# Patient Record
Sex: Male | Born: 1975 | Race: White | Hispanic: No | Marital: Married | Smoking: Former smoker
Health system: Southern US, Community
[De-identification: ages and names within clinical notes are randomized; demographics above are authoritative.]

## PROBLEM LIST (undated history)

## (undated) DIAGNOSIS — G4730 Sleep apnea, unspecified: Secondary | ICD-10-CM

## (undated) DIAGNOSIS — E538 Deficiency of other specified B group vitamins: Secondary | ICD-10-CM

## (undated) DIAGNOSIS — E785 Hyperlipidemia, unspecified: Secondary | ICD-10-CM

## (undated) DIAGNOSIS — Z8619 Personal history of other infectious and parasitic diseases: Secondary | ICD-10-CM

## (undated) DIAGNOSIS — M5124 Other intervertebral disc displacement, thoracic region: Secondary | ICD-10-CM

## (undated) DIAGNOSIS — K279 Peptic ulcer, site unspecified, unspecified as acute or chronic, without hemorrhage or perforation: Secondary | ICD-10-CM

## (undated) DIAGNOSIS — I10 Essential (primary) hypertension: Secondary | ICD-10-CM

## (undated) DIAGNOSIS — K219 Gastro-esophageal reflux disease without esophagitis: Secondary | ICD-10-CM

## (undated) DIAGNOSIS — H3500 Unspecified background retinopathy: Secondary | ICD-10-CM

## (undated) DIAGNOSIS — S43439A Superior glenoid labrum lesion of unspecified shoulder, initial encounter: Secondary | ICD-10-CM

## (undated) DIAGNOSIS — E119 Type 2 diabetes mellitus without complications: Secondary | ICD-10-CM

## (undated) DIAGNOSIS — G2581 Restless legs syndrome: Secondary | ICD-10-CM

## (undated) HISTORY — PX: VASECTOMY: SHX75

## (undated) HISTORY — PX: CERVICAL DISC SURGERY: SHX588

---
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
## 2001-08-23 DIAGNOSIS — M5124 Other intervertebral disc displacement, thoracic region: Secondary | ICD-10-CM

## 2001-08-23 HISTORY — DX: Other intervertebral disc displacement, thoracic region: M51.24

## 2009-01-13 ENCOUNTER — Emergency Department: Payer: Self-pay

## 2010-08-23 DIAGNOSIS — S43439A Superior glenoid labrum lesion of unspecified shoulder, initial encounter: Secondary | ICD-10-CM

## 2010-08-23 HISTORY — PX: SHOULDER ARTHROSCOPY W/ SUPERIOR LABRAL ANTERIOR POSTERIOR LESION REPAIR: SHX2402

## 2010-08-23 HISTORY — DX: Superior glenoid labrum lesion of unspecified shoulder, initial encounter: S43.439A

## 2011-08-13 ENCOUNTER — Ambulatory Visit: Payer: Self-pay | Admitting: Internal Medicine

## 2015-09-23 ENCOUNTER — Other Ambulatory Visit: Payer: Self-pay | Admitting: Nurse Practitioner

## 2015-09-23 DIAGNOSIS — R1032 Left lower quadrant pain: Secondary | ICD-10-CM

## 2015-09-26 ENCOUNTER — Ambulatory Visit: Admission: RE | Admit: 2015-09-26 | Payer: Federal, State, Local not specified - PPO | Source: Ambulatory Visit

## 2015-10-13 ENCOUNTER — Ambulatory Visit
Admission: RE | Admit: 2015-10-13 | Discharge: 2015-10-13 | Disposition: A | Discharge status: Home / Self Care | Payer: Federal, State, Local not specified - PPO | Source: Ambulatory Visit | Attending: Nurse Practitioner | Admitting: Nurse Practitioner

## 2015-10-13 DIAGNOSIS — R1032 Left lower quadrant pain: Secondary | ICD-10-CM | POA: Diagnosis not present

## 2015-10-13 MED ORDER — IOHEXOL 300 MG/ML  SOLN
125.0000 mL | Freq: Once | INTRAMUSCULAR | Status: AC | PRN
  Administered 2015-10-13: 125 mL via INTRAVENOUS

## 2015-12-11 ENCOUNTER — Encounter: Payer: Self-pay | Admitting: *Deleted

## 2015-12-12 ENCOUNTER — Ambulatory Visit
Admission: RE | Admit: 2015-12-12 | Payer: Federal, State, Local not specified - PPO | Source: Ambulatory Visit | Admitting: Gastroenterology

## 2015-12-12 ENCOUNTER — Encounter: Payer: Self-pay | Admitting: Registered Nurse

## 2015-12-12 ENCOUNTER — Encounter: Admission: RE | Payer: Self-pay | Source: Ambulatory Visit

## 2015-12-12 HISTORY — DX: Unspecified background retinopathy: H35.00

## 2015-12-12 HISTORY — DX: Restless legs syndrome: G25.81

## 2015-12-12 HISTORY — DX: Superior glenoid labrum lesion of unspecified shoulder, initial encounter: S43.439A

## 2015-12-12 HISTORY — DX: Type 2 diabetes mellitus without complications: E11.9

## 2015-12-12 HISTORY — DX: Personal history of other infectious and parasitic diseases: Z86.19

## 2015-12-12 HISTORY — DX: Hyperlipidemia, unspecified: E78.5

## 2015-12-12 HISTORY — DX: Deficiency of other specified B group vitamins: E53.8

## 2015-12-12 HISTORY — DX: Sleep apnea, unspecified: G47.30

## 2015-12-12 HISTORY — DX: Other intervertebral disc displacement, thoracic region: M51.24

## 2015-12-12 HISTORY — DX: Peptic ulcer, site unspecified, unspecified as acute or chronic, without hemorrhage or perforation: K27.9

## 2015-12-12 SURGERY — COLONOSCOPY WITH PROPOFOL
Anesthesia: General

## 2016-06-28 ENCOUNTER — Other Ambulatory Visit: Payer: Self-pay | Admitting: Orthopedic Surgery

## 2016-06-28 DIAGNOSIS — M2391 Unspecified internal derangement of right knee: Secondary | ICD-10-CM

## 2016-06-28 DIAGNOSIS — M25561 Pain in right knee: Secondary | ICD-10-CM

## 2016-07-09 ENCOUNTER — Ambulatory Visit
Admission: RE | Admit: 2016-07-09 | Discharge: 2016-07-09 | Disposition: A | Discharge status: Home / Self Care | Payer: Federal, State, Local not specified - PPO | Source: Ambulatory Visit | Attending: Orthopedic Surgery | Admitting: Orthopedic Surgery

## 2016-07-09 ENCOUNTER — Other Ambulatory Visit: Payer: Self-pay | Admitting: Orthopedic Surgery

## 2016-07-09 DIAGNOSIS — Z0189 Encounter for other specified special examinations: Secondary | ICD-10-CM | POA: Diagnosis not present

## 2016-07-09 DIAGNOSIS — X58XXXA Exposure to other specified factors, initial encounter: Secondary | ICD-10-CM | POA: Insufficient documentation

## 2016-07-09 DIAGNOSIS — M795 Residual foreign body in soft tissue: Secondary | ICD-10-CM

## 2016-07-09 DIAGNOSIS — M2391 Unspecified internal derangement of right knee: Secondary | ICD-10-CM

## 2016-07-09 DIAGNOSIS — M94261 Chondromalacia, right knee: Secondary | ICD-10-CM | POA: Insufficient documentation

## 2016-07-09 DIAGNOSIS — M25561 Pain in right knee: Secondary | ICD-10-CM | POA: Diagnosis present

## 2016-07-09 DIAGNOSIS — S83271A Complex tear of lateral meniscus, current injury, right knee, initial encounter: Secondary | ICD-10-CM | POA: Insufficient documentation

## 2016-08-25 ENCOUNTER — Encounter: Payer: Self-pay | Admitting: Anesthesiology

## 2016-09-01 ENCOUNTER — Ambulatory Visit: Payer: Federal, State, Local not specified - PPO | Admitting: Anesthesiology

## 2016-09-01 ENCOUNTER — Ambulatory Visit
Admission: RE | Admit: 2016-09-01 | Discharge: 2016-09-01 | Disposition: A | Discharge status: Home / Self Care | Payer: Federal, State, Local not specified - PPO | Source: Ambulatory Visit | Attending: Surgery | Admitting: Surgery

## 2016-09-01 ENCOUNTER — Encounter
Admission: RE | Disposition: A | Discharge status: Home / Self Care | Payer: Self-pay | Source: Ambulatory Visit | Attending: Surgery

## 2016-09-01 DIAGNOSIS — Z791 Long term (current) use of non-steroidal anti-inflammatories (NSAID): Secondary | ICD-10-CM | POA: Diagnosis not present

## 2016-09-01 DIAGNOSIS — Z87891 Personal history of nicotine dependence: Secondary | ICD-10-CM | POA: Insufficient documentation

## 2016-09-01 DIAGNOSIS — Z794 Long term (current) use of insulin: Secondary | ICD-10-CM | POA: Insufficient documentation

## 2016-09-01 DIAGNOSIS — E785 Hyperlipidemia, unspecified: Secondary | ICD-10-CM | POA: Insufficient documentation

## 2016-09-01 DIAGNOSIS — E538 Deficiency of other specified B group vitamins: Secondary | ICD-10-CM | POA: Insufficient documentation

## 2016-09-01 DIAGNOSIS — I10 Essential (primary) hypertension: Secondary | ICD-10-CM | POA: Insufficient documentation

## 2016-09-01 DIAGNOSIS — W001XXA Fall from stairs and steps due to ice and snow, initial encounter: Secondary | ICD-10-CM | POA: Insufficient documentation

## 2016-09-01 DIAGNOSIS — K219 Gastro-esophageal reflux disease without esophagitis: Secondary | ICD-10-CM | POA: Diagnosis not present

## 2016-09-01 DIAGNOSIS — M25561 Pain in right knee: Secondary | ICD-10-CM | POA: Diagnosis present

## 2016-09-01 DIAGNOSIS — G4730 Sleep apnea, unspecified: Secondary | ICD-10-CM | POA: Insufficient documentation

## 2016-09-01 DIAGNOSIS — S83271A Complex tear of lateral meniscus, current injury, right knee, initial encounter: Secondary | ICD-10-CM | POA: Insufficient documentation

## 2016-09-01 DIAGNOSIS — Z79899 Other long term (current) drug therapy: Secondary | ICD-10-CM | POA: Insufficient documentation

## 2016-09-01 DIAGNOSIS — E10319 Type 1 diabetes mellitus with unspecified diabetic retinopathy without macular edema: Secondary | ICD-10-CM | POA: Insufficient documentation

## 2016-09-01 DIAGNOSIS — M1711 Unilateral primary osteoarthritis, right knee: Secondary | ICD-10-CM | POA: Diagnosis not present

## 2016-09-01 DIAGNOSIS — M94261 Chondromalacia, right knee: Secondary | ICD-10-CM | POA: Insufficient documentation

## 2016-09-01 DIAGNOSIS — Z9641 Presence of insulin pump (external) (internal): Secondary | ICD-10-CM | POA: Diagnosis not present

## 2016-09-01 DIAGNOSIS — Y990 Civilian activity done for income or pay: Secondary | ICD-10-CM | POA: Diagnosis not present

## 2016-09-01 DIAGNOSIS — Z9989 Dependence on other enabling machines and devices: Secondary | ICD-10-CM | POA: Diagnosis not present

## 2016-09-01 HISTORY — PX: KNEE ARTHROSCOPY: SHX127

## 2016-09-01 LAB — GLUCOSE, CAPILLARY
Glucose-Capillary: 124 mg/dL — ABNORMAL HIGH (ref 65–99)
Glucose-Capillary: 137 mg/dL — ABNORMAL HIGH (ref 65–99)

## 2016-09-01 SURGERY — ARTHROSCOPY, KNEE
Anesthesia: General | Site: Knee | Laterality: Right | Wound class: Clean

## 2016-09-01 MED ORDER — METOCLOPRAMIDE HCL 5 MG PO TABS: 5.0000 mg | ORAL_TABLET | Freq: Three times a day (TID) | ORAL | Status: DC | PRN

## 2016-09-01 MED ORDER — OXYCODONE HCL 5 MG PO TABS
5.0000 mg | ORAL_TABLET | ORAL | Status: DC | PRN
  Administered 2016-09-01: 5 mg via ORAL

## 2016-09-01 MED ORDER — DEXAMETHASONE SODIUM PHOSPHATE 4 MG/ML IJ SOLN
INTRAMUSCULAR | Status: DC | PRN
  Administered 2016-09-01: 4 mg via INTRAVENOUS

## 2016-09-01 MED ORDER — GLYCOPYRROLATE 0.2 MG/ML IJ SOLN
INTRAMUSCULAR | Status: DC | PRN
  Administered 2016-09-01: 0.2 mg via INTRAVENOUS

## 2016-09-01 MED ORDER — CEFAZOLIN SODIUM-DEXTROSE 2-4 GM/100ML-% IV SOLN
2.0000 g | Freq: Once | INTRAVENOUS | Status: AC
  Administered 2016-09-01: 2 g via INTRAVENOUS

## 2016-09-01 MED ORDER — ONDANSETRON HCL 4 MG/2ML IJ SOLN
INTRAMUSCULAR | Status: DC | PRN
  Administered 2016-09-01: 4 mg via INTRAVENOUS

## 2016-09-01 MED ORDER — LACTATED RINGERS IV SOLN
INTRAVENOUS | Status: DC
  Administered 2016-09-01: 13:00:00 via INTRAVENOUS

## 2016-09-01 MED ORDER — FENTANYL CITRATE (PF) 100 MCG/2ML IJ SOLN
INTRAMUSCULAR | Status: DC | PRN
  Administered 2016-09-01 (×2): 50 ug via INTRAVENOUS

## 2016-09-01 MED ORDER — LIDOCAINE HCL (PF) 1 % IJ SOLN
INTRAMUSCULAR | Status: DC | PRN
  Administered 2016-09-01: 30 mL

## 2016-09-01 MED ORDER — LIDOCAINE HCL (CARDIAC) 20 MG/ML IV SOLN
INTRAVENOUS | Status: DC | PRN
  Administered 2016-09-01: 50 mg via INTRATRACHEAL

## 2016-09-01 MED ORDER — ONDANSETRON HCL 4 MG/2ML IJ SOLN: 4.0000 mg | Freq: Four times a day (QID) | INTRAMUSCULAR | Status: DC | PRN

## 2016-09-01 MED ORDER — MIDAZOLAM HCL 5 MG/5ML IJ SOLN
INTRAMUSCULAR | Status: DC | PRN
Start: 2016-09-01 — End: 2016-09-01
  Administered 2016-09-01: 2 mg via INTRAVENOUS

## 2016-09-01 MED ORDER — OXYCODONE HCL 5 MG PO TABS: 5.0000 mg | ORAL_TABLET | ORAL | 0 refills | Status: DC | PRN

## 2016-09-01 MED ORDER — ONDANSETRON HCL 4 MG PO TABS
4.0000 mg | ORAL_TABLET | Freq: Four times a day (QID) | ORAL | Status: DC | PRN
Start: 2016-09-01 — End: 2016-09-01

## 2016-09-01 MED ORDER — FENTANYL CITRATE (PF) 100 MCG/2ML IJ SOLN
100.0000 ug | Freq: Once | INTRAMUSCULAR | Status: AC
  Administered 2016-09-01: 50 ug via INTRAVENOUS

## 2016-09-01 MED ORDER — POTASSIUM CHLORIDE IN NACL 20-0.9 MEQ/L-% IV SOLN: INTRAVENOUS | Status: DC

## 2016-09-01 MED ORDER — METOCLOPRAMIDE HCL 5 MG/ML IJ SOLN: 5.0000 mg | Freq: Three times a day (TID) | INTRAMUSCULAR | Status: DC | PRN

## 2016-09-01 MED ORDER — PROPOFOL 10 MG/ML IV BOLUS
INTRAVENOUS | Status: DC | PRN
Start: 2016-09-01 — End: 2016-09-01
  Administered 2016-09-01: 200 mg via INTRAVENOUS

## 2016-09-01 MED ORDER — BUPIVACAINE-EPINEPHRINE (PF) 0.5% -1:200000 IJ SOLN
INTRAMUSCULAR | Status: DC | PRN
  Administered 2016-09-01: 30 mL
  Administered 2016-09-01: 20 mL

## 2016-09-01 SURGICAL SUPPLY — 30 items
BANDAGE ELASTIC 6 LF NS (GAUZE/BANDAGES/DRESSINGS) ×2 IMPLANT
BLADE FULL RADIUS 3.5 (BLADE) ×2 IMPLANT
BUR ACROMIONIZER 4.0 (BURR) IMPLANT
CHLORAPREP W/TINT 26ML (MISCELLANEOUS) ×2 IMPLANT
COVER LIGHT HANDLE UNIVERSAL (MISCELLANEOUS) ×4 IMPLANT
CUFF TOURN SGL QUICK 30 (MISCELLANEOUS) ×1
CUFF TOURN SGL QUICK 34 (TOURNIQUET CUFF)
CUFF TRNQT CYL 34X4X40X1 (TOURNIQUET CUFF) IMPLANT
CUFF TRNQT CYL LO 30X4X (MISCELLANEOUS) ×1 IMPLANT
DRAPE IMP U-DRAPE 54X76 (DRAPES) ×2 IMPLANT
GAUZE SPONGE 4X4 12PLY STRL (GAUZE/BANDAGES/DRESSINGS) ×2 IMPLANT
GLOVE BIO SURGEON STRL SZ8 (GLOVE) ×4 IMPLANT
GLOVE INDICATOR 8.0 STRL GRN (GLOVE) ×4 IMPLANT
GOWN STRL REUS W/ TWL LRG LVL3 (GOWN DISPOSABLE) ×2 IMPLANT
GOWN STRL REUS W/ TWL XL LVL3 (GOWN DISPOSABLE) ×2 IMPLANT
GOWN STRL REUS W/TWL LRG LVL3 (GOWN DISPOSABLE) ×2
GOWN STRL REUS W/TWL XL LVL3 (GOWN DISPOSABLE) ×2
IV LACTATED RINGER IRRG 3000ML (IV SOLUTION) ×2
IV LR IRRIG 3000ML ARTHROMATIC (IV SOLUTION) ×2 IMPLANT
KIT ROOM TURNOVER OR (KITS) ×2 IMPLANT
MANIFOLD 4PT FOR NEPTUNE1 (MISCELLANEOUS) ×2 IMPLANT
NEEDLE HYPO 21X1.5 SAFETY (NEEDLE) ×4 IMPLANT
PACK ARTHROSCOPY KNEE (MISCELLANEOUS) ×2 IMPLANT
STRAP BODY AND KNEE 60X3 (MISCELLANEOUS) ×2 IMPLANT
SUT PROLENE 4 0 PS 2 18 (SUTURE) ×2 IMPLANT
SUT VIC AB 2-0 CT1 27 (SUTURE)
SUT VIC AB 2-0 CT1 TAPERPNT 27 (SUTURE) IMPLANT
SYR 50ML LL SCALE MARK (SYRINGE) ×2 IMPLANT
TUBING ARTHRO INFLOW-ONLY STRL (TUBING) ×2 IMPLANT
WAND HAND CNTRL MULTIVAC 90 (MISCELLANEOUS) IMPLANT

## 2016-09-01 NOTE — Op Note (Signed)
09/01/2016  3:14 PM  Patient:   Colton Hayes  Pre-Op Diagnosis:   Lateral meniscus tear with underlying degenerative joint disease, right knee.  Postoperative diagnosis:   Same.  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  Surgeon:   Maryagnes AmosJ. Jeffrey Oseias Horsey, M.D.  Asst:   Elgie CollardJerrod Fisher, PA-S  Anesthesia:   General LMA.  Findings:   As above. The medial meniscus was in excellent condition, as were the anterior and posterior cruciate ligaments. The articular surfaces of the patella, the medial tibial plateau, the medial femoral condyle, and the femoral trochlea all were in excellent condition. There were grade 1 chondromalacial changes involving the central portion of the lateral tibial plateau.   Complications:   None.  EBL:   2 cc.  Total fluids:   700 cc of crystalloid.  Tourniquet time:   None  Drains:   None  Closure:   4-0 Prolene interrupted sutures.  Brief clinical note:   The patient is a 41 year old male with a several month history of anterolateral right knee pain. His symptoms have persisted despite medications, activity modification, etc. His history and examination were consistent with a lateral meniscus tear, confirmed by MRI scan.  The patient presents at this time for arthroscopy, debridement, and partial lateral  meniscectomy.  Procedure:   The patient was brought into the operating room and lain in the supine position. After adequate general laryngeal mask anesthesia was obtained, a timeout was performed to verify the appropriate side. The patient's right  knee was injected sterilely using a solution of 30 cc of 1% lidocaine and 30 cc of 0.5% Sensorcaine with epinephrine. The right lower extremity was prepped with ChloraPrep solution before being draped sterilely. Preoperative antibiotics were administered. The expected portal sites were injected with 0.5%  Sensorcaine with epinephrine before the camera was placed in the anterolateral portal and instrumentation performed through the anteromedial portal. The knee was sequentially examined beginning in the suprapatellar pouch, then progressing to the patellofemoral space, the medial gutter compartment, the notch, and finally the lateral compartment and gutter. The findings were as described above. Abundant reactive synovial tissues anteriorly were debrided using the full-radius resector in order to improve visualization. Laterally, there was a tear involving the anterior portion of the lateral meniscus. This was debrided back to stable margins using the narrow baskets and full-radius resector. Some probing the remaining rim demonstrated excellent stability. There was some loose articular cartilage involving the anterolateral portion of the lateral femoral condyle. This area was debrided back to stable margins using the full-radius resector. The medial meniscus was intact to probing. The instruments were removed from the joint after suctioning the excess fluid. The portal sites were closed using 4-0 Prolene interrupted sutures before a sterile bulky dressing was applied to the knee. The patient was then awakened, extubated, and returned to the recovery room in satisfactory condition after tolerating the procedure well.

## 2016-09-01 NOTE — H&P (Signed)
Paper H&P to be scanned into permanent record. H&P reviewed. No changes. 

## 2016-09-01 NOTE — Discharge Instructions (Signed)
General Anesthesia, Adult, Care After °These instructions provide you with information about caring for yourself after your procedure. Your health care provider may also give you more specific instructions. Your treatment has been planned according to current medical practices, but problems sometimes occur. Call your health care provider if you have any problems or questions after your procedure. °What can I expect after the procedure? °After the procedure, it is common to have: °· Vomiting. °· A sore throat. °· Mental slowness. °It is common to feel: °· Nauseous. °· Cold or shivery. °· Sleepy. °· Tired. °· Sore or achy, even in parts of your body where you did not have surgery. °Follow these instructions at home: °For at least 24 hours after the procedure: °· Do not: °¨ Participate in activities where you could fall or become injured. °¨ Drive. °¨ Use heavy machinery. °¨ Drink alcohol. °¨ Take sleeping pills or medicines that cause drowsiness. °¨ Make important decisions or sign legal documents. °¨ Take care of children on your own. °· Rest. °Eating and drinking °· If you vomit, drink water, juice, or soup when you can drink without vomiting. °· Drink enough fluid to keep your urine clear or pale yellow. °· Make sure you have little or no nausea before eating solid foods. °· Follow the diet recommended by your health care provider. °General instructions °· Have a responsible adult stay with you until you are awake and alert. °· Return to your normal activities as told by your health care provider. Ask your health care provider what activities are safe for you. °· Take over-the-counter and prescription medicines only as told by your health care provider. °· If you smoke, do not smoke without supervision. °· Keep all follow-up visits as told by your health care provider. This is important. °Contact a health care provider if: °· You continue to have nausea or vomiting at home, and medicines are not helpful. °· You  cannot drink fluids or start eating again. °· You cannot urinate after 8-12 hours. °· You develop a skin rash. °· You have fever. °· You have increasing redness at the site of your procedure. °Get help right away if: °· You have difficulty breathing. °· You have chest pain. °· You have unexpected bleeding. °· You feel that you are having a life-threatening or urgent problem. °This information is not intended to replace advice given to you by your health care provider. Make sure you discuss any questions you have with your health care provider. °Document Released: 11/15/2000 Document Revised: 01/12/2016 Document Reviewed: 07/24/2015 °Elsevier Interactive Patient Education © 2017 [employer] ° °Keep dressing dry and intact.  °May shower after dressing changed on post-op day #4 (Sunday).  °Cover sutures with Band-Aids after drying off. °Apply ice frequently to knee. °Take ibuprofen 800 mg TID with meals for 7-10 days, then as necessary. °Take pain medication as prescribed or ES Tylenol when needed.  °May weight-bear as tolerated - use crutches or walker as needed. °Follow-up in 10-14 days or as scheduled. °

## 2016-09-01 NOTE — Anesthesia Postprocedure Evaluation (Signed)
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  (Right)  Patient location during evaluation: PACU Anesthesia Type: General Level of consciousness: awake and alert Pain management: pain level controlled Vital Signs Assessment: post-procedure vital signs reviewed and stable Respiratory status: spontaneous breathing, nonlabored ventilation, respiratory function stable and patient connected to nasal cannula oxygen Cardiovascular status: blood pressure returned to baseline and stable Postop Assessment: no signs of nausea or vomiting Anesthetic complications: no    Dorene GrebeMcCulloch, Lennin Osmond V

## 2016-09-01 NOTE — Anesthesia Preprocedure Evaluation (Signed)
Anesthesia Evaluation  Patient identified by MRN, date of birth, ID band Patient awake    Reviewed: Allergy & Precautions, NPO status , Patient's Chart, lab work & pertinent test results  Airway Mallampati: I  TM Distance: >3 FB Neck ROM: Full    Dental   Pulmonary sleep apnea and Continuous Positive Airway Pressure Ventilation , former smoker,    Pulmonary exam normal        Cardiovascular hypertension, Pt. on medications Normal cardiovascular exam     Neuro/Psych    GI/Hepatic GERD  Medicated,  Endo/Other  diabetes, Well Controlled, Type 1, Insulin Dependent  Renal/GU      Musculoskeletal   Abdominal   Peds  Hematology   Anesthesia Other Findings   Reproductive/Obstetrics                             Anesthesia Physical Anesthesia Plan  ASA: II  Anesthesia Plan: General   Post-op Pain Management:    Induction: Intravenous  Airway Management Planned: LMA  Additional Equipment:   Intra-op Plan:   Post-operative Plan:   Informed Consent: I have reviewed the patients History and Physical, chart, labs and discussed the procedure including the risks, benefits and alternatives for the proposed anesthesia with the patient or authorized representative who has indicated his/her understanding and acceptance.     Plan Discussed with: CRNA  Anesthesia Plan Comments:         Anesthesia Quick Evaluation

## 2016-09-01 NOTE — Anesthesia Procedure Notes (Signed)
Procedure Name: LMA Insertion Date/Time: 09/01/2016 2:19 PM Performed by: Andee PolesBUSH, Everett Ricciardelli Pre-anesthesia Checklist: Patient identified, Emergency Drugs available, Suction available, Timeout performed and Patient being monitored Patient Re-evaluated:Patient Re-evaluated prior to inductionOxygen Delivery Method: Circle system utilized Preoxygenation: Pre-oxygenation with 100% oxygen Intubation Type: IV induction LMA: LMA inserted LMA Size: 4.0 Number of attempts: 1 Placement Confirmation: positive ETCO2 and breath sounds checked- equal and bilateral Tube secured with: Tape

## 2016-09-01 NOTE — Transfer of Care (Signed)
Immediate Anesthesia Transfer of Care Note  Patient: Colton GalasJeremy W Hayes  Procedure(s) Performed: Procedure(s): ARTHROSCOPY KNEE WITH DEBRIDEMENT AND  PARTIAL LATERAL MENISCECTOMY (Right)  Patient Location: PACU  Anesthesia Type: General  Level of Consciousness: awake, alert  and patient cooperative  Airway and Oxygen Therapy: Patient Spontanous Breathing and Patient connected to supplemental oxygen  Post-op Assessment: Post-op Vital signs reviewed, Patient's Cardiovascular Status Stable, Respiratory Function Stable, Patent Airway and No signs of Nausea or vomiting  Post-op Vital Signs: Reviewed and stable  Complications: No apparent anesthesia complications

## 2016-09-02 ENCOUNTER — Encounter: Payer: Self-pay | Admitting: Surgery

## 2020-07-25 ENCOUNTER — Other Ambulatory Visit: Payer: Self-pay | Admitting: Neurosurgery

## 2020-07-25 DIAGNOSIS — G8929 Other chronic pain: Secondary | ICD-10-CM

## 2020-07-31 ENCOUNTER — Other Ambulatory Visit: Payer: Self-pay

## 2020-07-31 ENCOUNTER — Ambulatory Visit
Admission: RE | Admit: 2020-07-31 | Discharge: 2020-07-31 | Disposition: A | Discharge status: Home / Self Care | Payer: Federal, State, Local not specified - PPO | Source: Ambulatory Visit | Attending: Neurosurgery | Admitting: Neurosurgery

## 2020-07-31 DIAGNOSIS — G8929 Other chronic pain: Secondary | ICD-10-CM | POA: Insufficient documentation

## 2020-07-31 DIAGNOSIS — M5442 Lumbago with sciatica, left side: Secondary | ICD-10-CM | POA: Diagnosis present

## 2020-07-31 DIAGNOSIS — M5441 Lumbago with sciatica, right side: Secondary | ICD-10-CM | POA: Insufficient documentation

## 2020-08-05 ENCOUNTER — Ambulatory Visit: Payer: Federal, State, Local not specified - PPO

## 2020-08-18 ENCOUNTER — Other Ambulatory Visit: Payer: Self-pay | Admitting: Neurosurgery

## 2020-08-27 ENCOUNTER — Encounter
Admission: RE | Admit: 2020-08-27 | Discharge: 2020-08-27 | Disposition: A | Discharge status: Home / Self Care | Payer: Federal, State, Local not specified - PPO | Source: Ambulatory Visit | Attending: Neurosurgery | Admitting: Neurosurgery

## 2020-08-27 ENCOUNTER — Other Ambulatory Visit: Payer: Self-pay

## 2020-08-27 DIAGNOSIS — E118 Type 2 diabetes mellitus with unspecified complications: Secondary | ICD-10-CM | POA: Insufficient documentation

## 2020-08-27 DIAGNOSIS — G4730 Sleep apnea, unspecified: Secondary | ICD-10-CM | POA: Diagnosis not present

## 2020-08-27 DIAGNOSIS — Z01818 Encounter for other preprocedural examination: Secondary | ICD-10-CM | POA: Insufficient documentation

## 2020-08-27 HISTORY — DX: Gastro-esophageal reflux disease without esophagitis: K21.9

## 2020-08-27 HISTORY — DX: Essential (primary) hypertension: I10

## 2020-08-27 LAB — URINALYSIS, ROUTINE W REFLEX MICROSCOPIC
Bilirubin Urine: NEGATIVE
Glucose, UA: NEGATIVE mg/dL
Hgb urine dipstick: NEGATIVE
Ketones, ur: NEGATIVE mg/dL
Leukocytes,Ua: NEGATIVE
Nitrite: NEGATIVE
Protein, ur: NEGATIVE mg/dL
Specific Gravity, Urine: 1.009 (ref 1.005–1.030)
pH: 6 (ref 5.0–8.0)

## 2020-08-27 LAB — SURGICAL PCR SCREEN
MRSA, PCR: NEGATIVE
Staphylococcus aureus: NEGATIVE

## 2020-08-27 LAB — PROTIME-INR
INR: 0.9 (ref 0.8–1.2)
Prothrombin Time: 12.2 seconds (ref 11.4–15.2)

## 2020-08-27 LAB — TYPE AND SCREEN
ABO/RH(D): O POS
Antibody Screen: NEGATIVE

## 2020-08-27 LAB — BASIC METABOLIC PANEL
Anion gap: 10 (ref 5–15)
BUN: 12 mg/dL (ref 6–20)
CO2: 27 mmol/L (ref 22–32)
Calcium: 9.3 mg/dL (ref 8.9–10.3)
Chloride: 102 mmol/L (ref 98–111)
Creatinine, Ser: 1.11 mg/dL (ref 0.61–1.24)
GFR, Estimated: >60 mL/min (ref >60)
Glucose, Bld: 143 mg/dL — ABNORMAL HIGH (ref 70–99)
Potassium: 3.8 mmol/L (ref 3.5–5.1)
Sodium: 139 mmol/L (ref 135–145)

## 2020-08-27 LAB — CBC
HCT: 40.5 % (ref 39.0–52.0)
Hemoglobin: 14.3 g/dL (ref 13.0–17.0)
MCH: 30.3 pg (ref 26.0–34.0)
MCHC: 35.3 g/dL (ref 30.0–36.0)
MCV: 85.8 fL (ref 80.0–100.0)
Platelets: 221 10*3/uL (ref 150–400)
RBC: 4.72 MIL/uL (ref 4.22–5.81)
RDW: 12.7 % (ref 11.5–15.5)
WBC: 6.1 10*3/uL (ref 4.0–10.5)
nRBC: 0 % (ref 0.0–0.2)

## 2020-08-27 LAB — APTT: aPTT: 31 seconds (ref 24–36)

## 2020-08-27 NOTE — Patient Instructions (Addendum)
Your procedure is scheduled on: 09/03/20 Report to DAY SURGERY DEPARTMENT LOCATED ON 2ND FLOOR MEDICAL MALL ENTRANCE. Check in at the Admitting Desk first To find out your arrival time please call [phone] between 1PM - 3PM on 09/02/20.  Remember: Instructions that are not followed completely may result in serious medical risk, up to and including death, or upon the discretion of your surgeon and anesthesiologist your surgery may need to be rescheduled.     _X__ 1. Do not eat food after midnight the night before your procedure.                 No gum chewing or hard candies. You may drink clear liquids up to 2 hours                 before you are scheduled to arrive for your surgery- DO not drink clear                 liquids within 2 hours of the start of your surgery.                 Clear Liquids include:  water, apple juice without pulp, clear carbohydrate                 drink such as Clearfast or Gatorade, Black Coffee or Tea (Do not add                 anything to coffee or tea). Diabetics water only  __X__2.  On the morning of surgery brush your teeth with toothpaste and water, you                 may rinse your mouth with mouthwash if you wish.  Do not swallow any              toothpaste of mouthwash.     _X__ 3.  No Alcohol for 24 hours before or after surgery.   _X__ 4.  Do Not Smoke or use e-cigarettes For 24 Hours Prior to Your Surgery.                 Do not use any chewable tobacco products for at least 6 hours prior to                 surgery.  ____  5.  Bring all medications with you on the day of surgery if instructed.   __X__  6.  Notify your doctor if there is any change in your medical condition      (cold, fever, infections).     Do not wear jewelry, make-up, hairpins, clips or nail polish. Do not wear lotions, powders, or perfumes.  Do not shave 48 hours prior to surgery. Men may shave face and neck. Do not bring valuables to the hospital.    [facility] is  not responsible for any belongings or valuables.  Contacts, dentures/partials or body piercings may not be worn into surgery. Bring a case for your contacts, glasses or hearing aids, a denture cup will be supplied. Leave your suitcase in the car. After surgery it may be brought to your room. For patients admitted to the hospital, discharge time is determined by your treatment team.   Patients discharged the day of surgery will not be allowed to drive home.   Please read over the following fact sheets that you were given:   MRSA Information, CHG, Incentive Spirometer  __X__ Take these medicines the  morning of surgery with A SIP OF WATER:    1. gabapentin (NEURONTIN) 600 MG capsule  2. omeprazole (PRILOSEC) 20 MG capsule  3. rosuvastatin (CRESTOR) 5 MG tablet  4.  5.  6.  ____ Fleet Enema (as directed)   __X__ Use CHG Soap/SAGE wipes as directed  ____ Use inhalers on the day of surgery  ____ Stop metformin/Janumet/Farxiga 2 days prior to surgery    ____ Take 1/2 of usual insulin dose the night before surgery. No insulin the morning          of surgery.   ____ Stop Blood Thinners Coumadin/Plavix/Xarelto/Pleta/Pradaxa/Eliquis/Effient/Aspirin  on   Or contact your Surgeon, Cardiologist or Medical Doctor regarding  ability to stop your blood thinners  __X__ Stop Anti-inflammatories 7 days before surgery such as Advil, Ibuprofen, Motrin,  BC or Goodies Powder, Naprosyn, Naproxen, Aleve, Aspirin    __X__ Stop all herbal supplements, fish oil or vitamin E until after surgery.    ____ Bring C-Pap to the hospital.    Contact PCP regarding insulin pump

## 2020-09-01 ENCOUNTER — Other Ambulatory Visit
Admission: RE | Admit: 2020-09-01 | Discharge: 2020-09-01 | Disposition: A | Discharge status: Home / Self Care | Payer: Federal, State, Local not specified - PPO | Source: Ambulatory Visit | Attending: Neurosurgery | Admitting: Neurosurgery

## 2020-09-01 ENCOUNTER — Other Ambulatory Visit: Payer: Self-pay

## 2020-09-01 DIAGNOSIS — Z01812 Encounter for preprocedural laboratory examination: Secondary | ICD-10-CM | POA: Insufficient documentation

## 2020-09-01 DIAGNOSIS — E10319 Type 1 diabetes mellitus with unspecified diabetic retinopathy without macular edema: Secondary | ICD-10-CM | POA: Diagnosis not present

## 2020-09-01 DIAGNOSIS — Z886 Allergy status to analgesic agent status: Secondary | ICD-10-CM | POA: Diagnosis not present

## 2020-09-01 DIAGNOSIS — Z20822 Contact with and (suspected) exposure to covid-19: Secondary | ICD-10-CM | POA: Diagnosis not present

## 2020-09-01 DIAGNOSIS — Z9641 Presence of insulin pump (external) (internal): Secondary | ICD-10-CM | POA: Diagnosis not present

## 2020-09-01 DIAGNOSIS — M5116 Intervertebral disc disorders with radiculopathy, lumbar region: Secondary | ICD-10-CM | POA: Diagnosis present

## 2020-09-01 DIAGNOSIS — Z791 Long term (current) use of non-steroidal anti-inflammatories (NSAID): Secondary | ICD-10-CM | POA: Diagnosis not present

## 2020-09-01 DIAGNOSIS — Z87891 Personal history of nicotine dependence: Secondary | ICD-10-CM | POA: Diagnosis not present

## 2020-09-01 DIAGNOSIS — Z833 Family history of diabetes mellitus: Secondary | ICD-10-CM | POA: Diagnosis not present

## 2020-09-01 DIAGNOSIS — Z888 Allergy status to other drugs, medicaments and biological substances status: Secondary | ICD-10-CM | POA: Diagnosis not present

## 2020-09-01 DIAGNOSIS — Z79899 Other long term (current) drug therapy: Secondary | ICD-10-CM | POA: Diagnosis not present

## 2020-09-01 DIAGNOSIS — Z823 Family history of stroke: Secondary | ICD-10-CM | POA: Diagnosis not present

## 2020-09-01 DIAGNOSIS — Z8249 Family history of ischemic heart disease and other diseases of the circulatory system: Secondary | ICD-10-CM | POA: Diagnosis not present

## 2020-09-01 DIAGNOSIS — Z794 Long term (current) use of insulin: Secondary | ICD-10-CM | POA: Diagnosis not present

## 2020-09-01 LAB — SARS CORONAVIRUS 2 (TAT 6-24 HRS): SARS Coronavirus 2: NEGATIVE

## 2020-09-03 ENCOUNTER — Ambulatory Visit: Payer: Federal, State, Local not specified - PPO | Admitting: Certified Registered Nurse Anesthetist

## 2020-09-03 ENCOUNTER — Ambulatory Visit
Admission: RE | Admit: 2020-09-03 | Discharge: 2020-09-03 | Disposition: A | Discharge status: Home / Self Care | Payer: Federal, State, Local not specified - PPO | Attending: Neurosurgery | Admitting: Neurosurgery

## 2020-09-03 ENCOUNTER — Encounter
Admission: RE | Disposition: A | Discharge status: Home / Self Care | Payer: Self-pay | Source: Home / Self Care | Attending: Neurosurgery

## 2020-09-03 ENCOUNTER — Other Ambulatory Visit: Payer: Self-pay

## 2020-09-03 ENCOUNTER — Ambulatory Visit: Payer: Federal, State, Local not specified - PPO

## 2020-09-03 ENCOUNTER — Encounter: Payer: Self-pay | Admitting: Neurosurgery

## 2020-09-03 DIAGNOSIS — Z791 Long term (current) use of non-steroidal anti-inflammatories (NSAID): Secondary | ICD-10-CM | POA: Insufficient documentation

## 2020-09-03 DIAGNOSIS — Z888 Allergy status to other drugs, medicaments and biological substances status: Secondary | ICD-10-CM | POA: Insufficient documentation

## 2020-09-03 DIAGNOSIS — Z886 Allergy status to analgesic agent status: Secondary | ICD-10-CM | POA: Insufficient documentation

## 2020-09-03 DIAGNOSIS — M5116 Intervertebral disc disorders with radiculopathy, lumbar region: Secondary | ICD-10-CM | POA: Insufficient documentation

## 2020-09-03 DIAGNOSIS — Z9641 Presence of insulin pump (external) (internal): Secondary | ICD-10-CM | POA: Insufficient documentation

## 2020-09-03 DIAGNOSIS — Z419 Encounter for procedure for purposes other than remedying health state, unspecified: Secondary | ICD-10-CM

## 2020-09-03 DIAGNOSIS — Z823 Family history of stroke: Secondary | ICD-10-CM | POA: Insufficient documentation

## 2020-09-03 DIAGNOSIS — Z79899 Other long term (current) drug therapy: Secondary | ICD-10-CM | POA: Insufficient documentation

## 2020-09-03 DIAGNOSIS — Z8249 Family history of ischemic heart disease and other diseases of the circulatory system: Secondary | ICD-10-CM | POA: Insufficient documentation

## 2020-09-03 DIAGNOSIS — Z20822 Contact with and (suspected) exposure to covid-19: Secondary | ICD-10-CM | POA: Insufficient documentation

## 2020-09-03 DIAGNOSIS — Z87891 Personal history of nicotine dependence: Secondary | ICD-10-CM | POA: Insufficient documentation

## 2020-09-03 DIAGNOSIS — Z794 Long term (current) use of insulin: Secondary | ICD-10-CM | POA: Insufficient documentation

## 2020-09-03 DIAGNOSIS — E10319 Type 1 diabetes mellitus with unspecified diabetic retinopathy without macular edema: Secondary | ICD-10-CM | POA: Insufficient documentation

## 2020-09-03 DIAGNOSIS — Z833 Family history of diabetes mellitus: Secondary | ICD-10-CM | POA: Insufficient documentation

## 2020-09-03 HISTORY — PX: LUMBAR LAMINECTOMY/DECOMPRESSION MICRODISCECTOMY: SHX5026

## 2020-09-03 LAB — GLUCOSE, CAPILLARY
Glucose-Capillary: 93 mg/dL (ref 70–99)
Glucose-Capillary: 130 mg/dL — ABNORMAL HIGH (ref 70–99)
Glucose-Capillary: 164 mg/dL — ABNORMAL HIGH (ref 70–99)

## 2020-09-03 LAB — ABO/RH: ABO/RH(D): O POS

## 2020-09-03 SURGERY — LUMBAR LAMINECTOMY/DECOMPRESSION MICRODISCECTOMY 2 LEVELS
Anesthesia: General

## 2020-09-03 MED ORDER — ACETAMINOPHEN 10 MG/ML IV SOLN
INTRAVENOUS | Status: AC
  Filled 2020-09-03: qty 100

## 2020-09-03 MED ORDER — CHLORHEXIDINE GLUCONATE 0.12 % MT SOLN
OROMUCOSAL | Status: AC
  Administered 2020-09-03: 15 mL via OROMUCOSAL
  Filled 2020-09-03: qty 15

## 2020-09-03 MED ORDER — LIDOCAINE HCL (CARDIAC) PF 100 MG/5ML IV SOSY
PREFILLED_SYRINGE | INTRAVENOUS | Status: DC | PRN
  Administered 2020-09-03: 100 mg via INTRAVENOUS

## 2020-09-03 MED ORDER — FENTANYL CITRATE (PF) 100 MCG/2ML IJ SOLN
INTRAMUSCULAR | Status: DC | PRN
  Administered 2020-09-03: 100 ug via INTRAVENOUS

## 2020-09-03 MED ORDER — KETAMINE HCL 50 MG/5ML IJ SOSY
PREFILLED_SYRINGE | INTRAMUSCULAR | Status: AC
  Filled 2020-09-03: qty 5

## 2020-09-03 MED ORDER — SUGAMMADEX SODIUM 200 MG/2ML IV SOLN
INTRAVENOUS | Status: DC | PRN
  Administered 2020-09-03: 232.2 mg via INTRAVENOUS

## 2020-09-03 MED ORDER — CHLORHEXIDINE GLUCONATE 0.12 % MT SOLN: 15.0000 mL | Freq: Once | OROMUCOSAL | Status: AC

## 2020-09-03 MED ORDER — ONDANSETRON HCL 4 MG/2ML IJ SOLN
INTRAMUSCULAR | Status: DC | PRN
  Administered 2020-09-03: 4 mg via INTRAVENOUS

## 2020-09-03 MED ORDER — CEFAZOLIN SODIUM-DEXTROSE 2-4 GM/100ML-% IV SOLN
2.0000 g | Freq: Once | INTRAVENOUS | Status: AC
  Administered 2020-09-03: 2 g via INTRAVENOUS

## 2020-09-03 MED ORDER — SODIUM CHLORIDE 0.9 % IV SOLN
INTRAVENOUS | Status: DC | PRN
  Administered 2020-09-03: 30 ug/min via INTRAVENOUS

## 2020-09-03 MED ORDER — CEFAZOLIN SODIUM-DEXTROSE 2-4 GM/100ML-% IV SOLN
INTRAVENOUS | Status: AC
  Filled 2020-09-03: qty 100

## 2020-09-03 MED ORDER — PHENYLEPHRINE HCL (PRESSORS) 10 MG/ML IV SOLN
INTRAVENOUS | Status: DC | PRN
  Administered 2020-09-03 (×6): 100 ug via INTRAVENOUS

## 2020-09-03 MED ORDER — REMIFENTANIL HCL 1 MG IV SOLR
INTRAVENOUS | Status: AC
  Filled 2020-09-03: qty 1000

## 2020-09-03 MED ORDER — LACTATED RINGERS IV SOLN: INTRAVENOUS | Status: DC | PRN

## 2020-09-03 MED ORDER — FENTANYL CITRATE (PF) 100 MCG/2ML IJ SOLN
INTRAMUSCULAR | Status: AC
  Filled 2020-09-03: qty 2

## 2020-09-03 MED ORDER — SUCCINYLCHOLINE CHLORIDE 20 MG/ML IJ SOLN
INTRAMUSCULAR | Status: DC | PRN
  Administered 2020-09-03: 140 mg via INTRAVENOUS

## 2020-09-03 MED ORDER — HYDROMORPHONE HCL 1 MG/ML IJ SOLN
INTRAMUSCULAR | Status: AC
  Filled 2020-09-03: qty 1

## 2020-09-03 MED ORDER — OXYCODONE HCL 5 MG PO TABS
5.0000 mg | ORAL_TABLET | Freq: Once | ORAL | Status: AC
  Administered 2020-09-03: 5 mg via ORAL

## 2020-09-03 MED ORDER — EPHEDRINE SULFATE 50 MG/ML IJ SOLN
INTRAMUSCULAR | Status: DC | PRN
  Administered 2020-09-03: 15 mg via INTRAVENOUS
  Administered 2020-09-03: 5 mg via INTRAVENOUS
  Administered 2020-09-03: 15 mg via INTRAVENOUS
  Administered 2020-09-03: 5 mg via INTRAVENOUS

## 2020-09-03 MED ORDER — LIDOCAINE HCL (PF) 2 % IJ SOLN
INTRAMUSCULAR | Status: DC | PRN
  Administered 2020-09-03: 1 mg/kg/h via INTRADERMAL

## 2020-09-03 MED ORDER — MIDAZOLAM HCL 2 MG/2ML IJ SOLN
INTRAMUSCULAR | Status: DC | PRN
  Administered 2020-09-03: 2 mg via INTRAVENOUS

## 2020-09-03 MED ORDER — SODIUM CHLORIDE 0.9 % IV SOLN
INTRAVENOUS | Status: DC | PRN
  Administered 2020-09-03: 40 mL

## 2020-09-03 MED ORDER — FENTANYL CITRATE (PF) 100 MCG/2ML IJ SOLN
INTRAMUSCULAR | Status: AC
  Administered 2020-09-03: 25 ug via INTRAVENOUS
  Filled 2020-09-03: qty 2

## 2020-09-03 MED ORDER — THROMBIN 5000 UNITS EX SOLR
CUTANEOUS | Status: DC | PRN
  Administered 2020-09-03: 5000 [IU] via TOPICAL

## 2020-09-03 MED ORDER — ORAL CARE MOUTH RINSE: 15.0000 mL | Freq: Once | OROMUCOSAL | Status: AC

## 2020-09-03 MED ORDER — PROPOFOL 10 MG/ML IV BOLUS
INTRAVENOUS | Status: DC | PRN
  Administered 2020-09-03: 200 mg via INTRAVENOUS

## 2020-09-03 MED ORDER — ACETAMINOPHEN 10 MG/ML IV SOLN
INTRAVENOUS | Status: DC | PRN
  Administered 2020-09-03: 1000 mg via INTRAVENOUS

## 2020-09-03 MED ORDER — MIDAZOLAM HCL 2 MG/2ML IJ SOLN
INTRAMUSCULAR | Status: AC
  Filled 2020-09-03: qty 2

## 2020-09-03 MED ORDER — OXYCODONE HCL 5 MG PO TABS
ORAL_TABLET | ORAL | Status: AC
  Filled 2020-09-03: qty 1

## 2020-09-03 MED ORDER — ROCURONIUM BROMIDE 100 MG/10ML IV SOLN
INTRAVENOUS | Status: DC | PRN
  Administered 2020-09-03: 5 mg via INTRAVENOUS
  Administered 2020-09-03: 15 mg via INTRAVENOUS

## 2020-09-03 MED ORDER — KETAMINE HCL 10 MG/ML IJ SOLN
INTRAMUSCULAR | Status: DC | PRN
  Administered 2020-09-03: 40 mg via INTRAVENOUS

## 2020-09-03 MED ORDER — SODIUM CHLORIDE 0.9 % IV SOLN: INTRAVENOUS | Status: DC

## 2020-09-03 MED ORDER — BUPIVACAINE-EPINEPHRINE (PF) 0.5% -1:200000 IJ SOLN
INTRAMUSCULAR | Status: DC | PRN
  Administered 2020-09-03: 4 mL

## 2020-09-03 MED ORDER — DEXAMETHASONE SODIUM PHOSPHATE 10 MG/ML IJ SOLN
INTRAMUSCULAR | Status: DC | PRN
  Administered 2020-09-03: 10 mg via INTRAVENOUS

## 2020-09-03 MED ORDER — SODIUM CHLORIDE 0.9 % IV SOLN
INTRAVENOUS | Status: DC | PRN
  Administered 2020-09-03: .1 ug/kg/min via INTRAVENOUS

## 2020-09-03 MED ORDER — METHYLPREDNISOLONE ACETATE 40 MG/ML IJ SUSP
INTRAMUSCULAR | Status: DC | PRN
  Administered 2020-09-03: 40 mg

## 2020-09-03 MED ORDER — ONDANSETRON HCL 4 MG/2ML IJ SOLN: 4.0000 mg | Freq: Once | INTRAMUSCULAR | Status: DC | PRN

## 2020-09-03 MED ORDER — OXYCODONE HCL 5 MG PO TABS: 5.0000 mg | ORAL_TABLET | ORAL | 0 refills | Status: AC | PRN

## 2020-09-03 MED ORDER — FENTANYL CITRATE (PF) 100 MCG/2ML IJ SOLN
25.0000 ug | INTRAMUSCULAR | Status: DC | PRN
Start: 2020-09-03 — End: 2020-09-03
  Administered 2020-09-03 (×2): 25 ug via INTRAVENOUS

## 2020-09-03 SURGICAL SUPPLY — 54 items
ADH SKN CLS APL DERMABOND .7 (GAUZE/BANDAGES/DRESSINGS) ×1
AGENT HMST MTR 8 SURGIFLO (HEMOSTASIS) ×1
APL PRP STRL LF DISP 70% ISPRP (MISCELLANEOUS) ×1
BUR NEURO DRILL SOFT 3.0X3.8M (BURR) ×2 IMPLANT
CHLORAPREP W/TINT 26 (MISCELLANEOUS) ×2 IMPLANT
CNTNR SPEC 2.5X3XGRAD LEK (MISCELLANEOUS) ×1
CONT SPEC 4OZ STER OR WHT (MISCELLANEOUS) ×1
CONT SPEC 4OZ STRL OR WHT (MISCELLANEOUS) ×1
CONTAINER SPEC 2.5X3XGRAD LEK (MISCELLANEOUS) ×1 IMPLANT
COUNTER NEEDLE 20/40 LG (NEEDLE) ×2 IMPLANT
COVER WAND RF STERILE (DRAPES) ×2 IMPLANT
CUP MEDICINE 2OZ PLAST GRAD ST (MISCELLANEOUS) ×2 IMPLANT
DERMABOND ADVANCED (GAUZE/BANDAGES/DRESSINGS) ×1
DERMABOND ADVANCED .7 DNX12 (GAUZE/BANDAGES/DRESSINGS) ×1 IMPLANT
DRAPE C ARM PK CFD 31 SPINE (DRAPES) ×2 IMPLANT
DRAPE LAPAROTOMY 100X77 ABD (DRAPES) ×2 IMPLANT
DRAPE MICROSCOPE SPINE 48X150 (DRAPES) ×2 IMPLANT
DRAPE SURG 17X11 SM STRL (DRAPES) ×8 IMPLANT
ELECT CAUTERY BLADE TIP 2.5 (TIP) ×2
ELECT EZSTD 165MM 6.5IN (MISCELLANEOUS) ×2
ELECT REM PT RETURN 9FT ADLT (ELECTROSURGICAL) ×2
ELECTRODE CAUTERY BLDE TIP 2.5 (TIP) ×1 IMPLANT
ELECTRODE EZSTD 165MM 6.5IN (MISCELLANEOUS) ×1 IMPLANT
ELECTRODE REM PT RTRN 9FT ADLT (ELECTROSURGICAL) ×1 IMPLANT
GLOVE SURG SYN 7.0 (GLOVE) ×4 IMPLANT
GLOVE SURG SYN 8.5  E (GLOVE) ×3
GLOVE SURG SYN 8.5 E (GLOVE) ×3 IMPLANT
GLOVE SURG UNDER POLY LF SZ7 (GLOVE) ×2 IMPLANT
GOWN SRG XL LVL 3 NONREINFORCE (GOWNS) ×1 IMPLANT
GOWN STRL NON-REIN TWL XL LVL3 (GOWNS) ×2
GOWN STRL REUS W/ TWL XL LVL3 (GOWN DISPOSABLE) ×1 IMPLANT
GOWN STRL REUS W/TWL XL LVL3 (GOWN DISPOSABLE) ×2
GRADUATE 1200CC STRL 31836 (MISCELLANEOUS) ×2 IMPLANT
GRAFT DURAGEN MATRIX 1WX1L (Tissue) IMPLANT
KIT SPINAL PRONEVIEW (KITS) ×2 IMPLANT
KNIFE BAYONET SHORT DISCETOMY (MISCELLANEOUS) IMPLANT
MANIFOLD NEPTUNE II (INSTRUMENTS) ×2 IMPLANT
MARKER SKIN DUAL TIP RULER LAB (MISCELLANEOUS) ×4 IMPLANT
NDL SAFETY ECLIPSE 18X1.5 (NEEDLE) ×1 IMPLANT
NEEDLE HYPO 18GX1.5 SHARP (NEEDLE) ×2
NEEDLE HYPO 22GX1.5 SAFETY (NEEDLE) ×2 IMPLANT
NS IRRIG 1000ML POUR BTL (IV SOLUTION) ×2 IMPLANT
PACK LAMINECTOMY NEURO (CUSTOM PROCEDURE TRAY) ×2 IMPLANT
SLEEVE PROTECTION STRL DISP (MISCELLANEOUS) ×2 IMPLANT
SPOGE SURGIFLO 8M (HEMOSTASIS) ×1
SPONGE SURGIFLO 8M (HEMOSTASIS) ×1 IMPLANT
SUT DVC VLOC 3-0 CL 6 P-12 (SUTURE) ×2 IMPLANT
SUT VIC AB 0 CT1 27 (SUTURE) ×2
SUT VIC AB 0 CT1 27XCR 8 STRN (SUTURE) ×1 IMPLANT
SUT VIC AB 2-0 CT1 18 (SUTURE) ×2 IMPLANT
SYR 30ML LL (SYRINGE) ×4 IMPLANT
SYR 3ML LL SCALE MARK (SYRINGE) ×2 IMPLANT
TOWEL OR 17X26 4PK STRL BLUE (TOWEL DISPOSABLE) ×6 IMPLANT
TUBING CONNECTING 10 (TUBING) ×2 IMPLANT

## 2020-09-03 NOTE — Consult Note (Signed)
Pharmacy Antibiotic Note  Colton Hayes is a 45 y.o. male admitted on 09/03/2020 with surgical prophylaxis.    Pharmacy has been consulted for cefazolin dosing.  Plan: Cefazolin 2g IV x 1 (for pt wt < 120 kg)   May repeat dose intraoperatively in 4 hours if procedure is lengthy or if there is excessive blood loss - please contact pharmacy if additional dose is needed No data recorded.  Recent Labs  Lab 08/27/20 1423  WBC 6.1  CREATININE 1.11    Estimated Creatinine Clearance: 110.2 mL/min (by C-G formula based on SCr of 1.11 mg/dL).    Allergies  Allergen Reactions  . Lisinopril     Abdominal pain  . Nsaids     Due to hx. Bleeding ulcer   Thank you for allowing pharmacy to be a part of this patient's care.  Albina Billet, PharmD, BCPS Clinical Pharmacist 09/03/2020 8:22 AM

## 2020-09-03 NOTE — Op Note (Signed)
Indications: Colton Hayes is a 45 yo male who presented with lumbar radiculopathy. He failed conservative management prompting surgical intervention.  Findings: disc herniation L4-5  Preoperative Diagnosis: Lumbar radiculopathy Postoperative Diagnosis: same   EBL: 20 ml IVF: 1000 ml Drains: none Disposition: Extubated and Stable to PACU Complications: none  No foley catheter was placed.   Preoperative Note:   Risks of surgery discussed include: infection, bleeding, stroke, coma, death, paralysis, CSF leak, nerve/spinal cord injury, numbness, tingling, weakness, complex regional pain syndrome, recurrent stenosis and/or disc herniation, vascular injury, development of instability, neck/back pain, need for further surgery, persistent symptoms, development of deformity, and the risks of anesthesia. The patient understood these risks and agreed to proceed.  Operative Note:   1) left L4/5 microdiscectomy with right sided decompression as well  The patient was then brought from the preoperative center with intravenous access established.  The patient underwent general anesthesia and endotracheal tube intubation, and was then rotated on the [place] rail top where all pressure points were appropriately padded.  The skin was then thoroughly cleansed.  Perioperative antibiotic prophylaxis was administered.  Sterile prep and drapes were then applied and a timeout was then observed.  C-arm was brought into the field under sterile conditions, and the L4-5 disc space identified and marked with an incision on the left 1cm lateral to midline.  Once this was complete a 4 cm incision was opened with the use of a #10 blade knife.  The Metrx tubes were sequentially advanced under lateral fluoroscopy until a 18 x 70 mm Metrx tube was placed over the facet and lamina and secured to the bed.    The microscope was then sterilely brought into the field and muscle creep was hemostased with a bipolar and resected  with a pituitary rongeur.  A Bovie extender was then used to expose the spinous process and lamina.  Careful attention was placed to not violate the facet capsule. A 3 mm matchstick drill bit was then used to make a hemi-laminotomy trough until the ligamentum flavum was exposed.  This was extended to the base of the spinous process.  Once this was complete and the underlying ligamentum flavum was visualized this was dissected with an up angle curette and resected with a #2 and #3 mm biting Kerrison.  The laminotomy opening was also expanded in similar fashion and hemostasis was obtained with Surgifoam and a patty as well as bone wax.  The rostral aspect of the caudal level of the lamina was also resected with a #2 biting Kerrison effort to further enhance exposure. An area of ligamentum overgrowth was noted, and removed until the nerve root was decompression superficially.   Once the underlying dura was visualized a Penfield 4 was then used to dissect and expose the traversing nerve root.  Once this was identified a nerve root retractor suction was used to mobilize this medially.  The venous plexus was hemostased with Surgifoam and light bipolar use.  A small penfield was then used to make a small annulotomy within the disc space and disc space contents were noted to come through the annulus.    The disc herniation was identified and dissected free using a balltip probe. The pituitary rongeur was used to remove the extruded disc fragments. Once the thecal sac and nerve root were noted to be relaxed and under less tension the ball-tipped feeler was passed along the foramen distally to to ensure no residual compression was noted.    The punch was then  used to remove the ligamentum on the right side until the right L5 nerve root was decompressed.  The long balltip probe was used to ensure that the L5 nerve root was decompressed out the foramin, thus obviating the need to for R L5-S1 foraminal decompression from  the lower level.  Depo-Medrol was placed along the nerve root.  The area was irrigated. The tube system was then removed under microscopic visualization and hemostasis was obtained with a bipolar.    The fascial layer was reapproximated with the use of a 0- Vicryl suture.  Subcutaneous tissue layer was reapproximated using 2-0 Vicryl suture.  3-0 monocryl was used on the skin. The skin was then cleansed and Dermabond was used to close the skin opening.  Patient was then rotated back to the preoperative bed awakened from anesthesia and taken to recovery all counts are correct in this case.   I performed the entire procedure with the assistance of Patsey Berthold NP as an Designer, television/film set.  Venetia Night MD

## 2020-09-03 NOTE — Progress Notes (Signed)
Pt ambulated in hall without difficulty.

## 2020-09-03 NOTE — Transfer of Care (Signed)
Immediate Anesthesia Transfer of Care Note  Patient: Colton Hayes  Procedure(s) Performed: L4-5 MICRODISCECTOMY, L5-S1 DECOMPRESSION (N/A )  Patient Location: PACU  Anesthesia Type:General  Level of Consciousness: awake, alert  and oriented  Airway & Oxygen Therapy: Patient Spontanous Breathing and Patient connected to face mask oxygen  Post-op Assessment: Report given to RN and Post -op Vital signs reviewed and stable  Post vital signs: Reviewed and stable  Last Vitals:  Vitals Value Taken Time  BP 127/81 09/03/20 1118  Temp    Pulse 104 09/03/20 1119  Resp 18 09/03/20 1121  SpO2 100 % 09/03/20 1119  Vitals shown include unvalidated device data.  Last Pain:  Vitals:   09/03/20 0827  TempSrc: Temporal  PainSc: 6       Patients Stated Pain Goal: 0 (09/03/20 0827)  Complications: No complications documented.

## 2020-09-03 NOTE — Anesthesia Preprocedure Evaluation (Signed)
Anesthesia Evaluation  Patient identified by MRN, date of birth, ID band Patient awake    Reviewed: Allergy & Precautions, NPO status , Patient's Chart, lab work & pertinent test results  Airway Mallampati: II  TM Distance: >3 FB Neck ROM: Full    Dental   Pulmonary sleep apnea and Continuous Positive Airway Pressure Ventilation , former smoker,    Pulmonary exam normal        Cardiovascular hypertension, Pt. on medications Normal cardiovascular exam     Neuro/Psych negative neurological ROS  negative psych ROS   GI/Hepatic PUD, GERD  Medicated,  Endo/Other  diabetes, Well Controlled, Type 1, Insulin Dependent  Renal/GU   negative genitourinary   Musculoskeletal negative musculoskeletal ROS (+)   Abdominal   Peds negative pediatric ROS (+)  Hematology negative hematology ROS (+)   Anesthesia Other Findings Past Medical History: No date: B12 deficiency 1989: Diabetes mellitus without complication (HCC)     Comment:  uses insulin pump No date: GERD (gastroesophageal reflux disease) No date: History of chicken pox No date: Hyperlipidemia No date: Hypertension No date: PUD (peptic ulcer disease) No date: Retinopathy     Comment:  diabetic (right) No date: RLS (restless legs syndrome) 2003: Ruptured thoracic disc 2012: SLAP tear of shoulder No date: Sleep apnea     Comment:  uses CPAP  Reproductive/Obstetrics                             Anesthesia Physical  Anesthesia Plan  ASA: II  Anesthesia Plan: General   Post-op Pain Management:    Induction: Intravenous  PONV Risk Score and Plan:   Airway Management Planned: Oral ETT  Additional Equipment:   Intra-op Plan:   Post-operative Plan: Extubation in OR  Informed Consent: I have reviewed the patients History and Physical, chart, labs and discussed the procedure including the risks, benefits and alternatives for the  proposed anesthesia with the patient or authorized representative who has indicated his/her understanding and acceptance.       Plan Discussed with: CRNA  Anesthesia Plan Comments:         Anesthesia Quick Evaluation

## 2020-09-03 NOTE — H&P (Signed)
History of Present Illness: 09/03/2020 Colton Hayes presents today with continued pain primarily down his left leg, but also impacting his R leg.  07/23/2020 Colton Hayes is here today with a chief complaint of low back pain, bilateral hip pain, bilateral leg pain/numbness/tingling. He reports that when he sits for too long, his lower legs go numb and when he stands for too long, his thighs burn and go numb.  His worst pain is down his left leg. He has pain into his left buttock and left posterior thigh as well as anterolateral calf on the left side. He has some similar symptoms on the right side, but is mostly on the left.  His symptoms are slightly changed over the last few months. He reports burning constant discomfort in his legs that is 6 out of 10. Standing, walking, sitting, twisting, bending makes pain worse. Gabapentin helps some. At the end of the day his pain is the worst.   Conservative measures: heat, ice Physical therapy: participated at [facility] in Summer 2021 Multimodal medical therapy including regular antiinflammatories: celebrex, cyclobenzaprine, gabapentin, tramadol, oxycodone, meloxicam, tylenol Injections: has received epidural steroid injections - initially made symptoms worse, then had some relief for about 1 week 04/17/20: L4-5 interlaminar ESI at [facility] 12/27/19: L4-5 interlaminar ESI at [facility]  Past Surgery: cervical arthroplasty by Dr Waymon Budge 01/2019  Cherylann Ratel has no symptoms of cervical myelopathy.  The symptoms are causing a significant impact on the patient's life.   Review of Systems:  A 10 point review of systems is negative, except for the pertinent positives and negatives detailed in the HPI.  Past Medical History: Past Medical History:  Diagnosis Date  . B12 deficiency 03/2015  . Chronic, continuous use of opioids  [facility] signed on 02/01/17 & 03/27/18. Initial UDS done 02/01/17; updated 03/27/18.  . Closed fracture  of rib 09/27/2019  . Complex tear of lateral meniscus of right knee as current injury 08/04/2016  . Diabetes type 1, controlled (CMS-HCC) 1989  On insulin pump therapy.  . Elevated LFTs 04/2020  . Encounter for blood transfusion  . Essential hypertension 10/16/2019  . Fracture of leg Teenager  . GERD (gastroesophageal reflux disease) Since I was 11  . History of chicken pox  . Hyperlipidemia  Triglyceride predominance  . Lumbar radiculopathy 09/27/2019  . OSA on CPAP  . Presence of insulin pump  . Primary osteoarthritis of right knee 08/04/2016  . PUD (peptic ulcer disease) 01/09/2009  Requiring hospitalization at [facility].  Colton Hayes Restless legs syndrome (RLS)  . Retinopathy, background, diabetic (CMS-HCC) 04/14/2012  Right  . Rotator cuff tendinitis, left 03/10/2016  . Ruptured thoracic disc 2003  . SLAP tear of shoulder 2012  . Tinnitus  w/ some hearing loss on the Lt; has hearing aid on Lt side 2019.   Past Surgical History: Past Surgical History:  Procedure Laterality Date  . 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 ARTHROSCOPY SHOULDER Left 01/09/14  [facility]  . Cervical Disc Arthroplasty 01/2019  C5-6 & C6-7. Done by Dr. Waymon Budge at [facility].  Colton Hayes KNEE ARTHROSCOPY 09/01/2016  . SHOULDER ARTHROSCOPY W/ SUPERIOR LABRAL ANTERIOR POSTERIOR LESION REPAIR Left 2012  repair of anterior labrum  . VASECTOMY 2005    Allergies  Allergen Reactions  . Lisinopril     Abdominal pain  . Nsaids     Due to hx. Bleeding ulcer  Current Meds  Medication Sig  . ascorbic acid (VITAMIN C) 500 MG tablet Take 1,500 mg by mouth daily.  . BENFOTIAMINE PO Take 300 mg by mouth daily.  . celecoxib (CELEBREX) 200 MG capsule Take 200 mg by mouth 2 (two) times daily.  . cyclobenzaprine (FLEXERIL) 5 MG tablet Take 10 mg by mouth at bedtime.  . fluticasone (FLONASE) 50  MCG/ACT nasal spray Place 1 spray into both nostrils daily as needed for rhinitis.  Colton Hayes gabapentin (NEURONTIN) 300 MG capsule Take 300-600 mg by mouth See admin instructions. Take 300 mg in the morning and 600 mg at night  . insulin aspart (NOVOLOG) 100 UNIT/ML injection Inject 100 Units into the skin daily. Uses in insulin pump  . losartan (COZAAR) 100 MG tablet Take 100 mg by mouth daily.  Colton Hayes omeprazole (PRILOSEC) 20 MG capsule Take 20 mg by mouth daily.  . rosuvastatin (CRESTOR) 5 MG tablet Take 5 mg by mouth daily.  . traMADol (ULTRAM) 50 MG tablet Take 50 mg by mouth every 8 (eight) hours as needed for moderate pain.  Colton Hayes zinc gluconate 50 MG tablet Take 100 mg by mouth daily.    Social History: Social History   Tobacco Use  . Smoking status: Former Smoker  Packs/day: 0.00  Years: 0.00  Pack years: 0.00  Types: Cigarettes  Quit date: 01/30/1992  Years since quitting: 28.4  . Smokeless tobacco: Current User  Types: Snuff  Vaping Use  . Vaping Use: Never used  Substance Use Topics  . Alcohol use: No  Alcohol/week: 0.0 standard drinks  . Drug use: No   Family Medical History: Family History  Problem Relation Age of Onset  . Stroke Other  . High blood pressure (Hypertension) Mother  . Diabetes type II Father   Physical Examination:  Vitals:   09/03/20 0827  BP: 124/68  Pulse: 92  Resp: 16  Temp: 98.3 F (36.8 C)  SpO2: 100%   Heart sounds normal no MRG. Chest Clear to Auscultation Bilaterally.   General: Patient is well developed, well nourished, calm, collected, and in no apparent distress. Attention to examination is appropriate.  Psychiatric: Patient is non-anxious.  Head: Pupils equal, round, and reactive to light.  ENT: Oral mucosa appears well hydrated.  Neck: Supple. Full range of motion.  Respiratory: Patient is breathing without any difficulty.  Extremities: No edema.  Vascular: Palpable dorsal pedal pulses.  Skin: On exposed skin, there are no  abnormal skin lesions.  NEUROLOGICAL:   Awake, alert, oriented to person, place, and time. Speech is clear and fluent. Fund of knowledge is appropriate.   Cranial Nerves: Pupils equal round and reactive to light. Facial tone is symmetric. Facial sensation is symmetric. Shoulder shrug is symmetric. Tongue protrusion is midline. There is no pronator drift.  ROM of spine: diminished.  Strength: Side Biceps Triceps Deltoid Interossei Grip Wrist Ext. Wrist Flex.  R 5 5 5 5 5 5 5   L 5 5 5 5 5 5 5    Side Iliopsoas Quads Hamstring PF DF EHL  R 5 5 5 5 5 5   L 5 5 5 5 5 5    Reflexes are 1+ and symmetric at the biceps, triceps, brachioradialis, patella and achilles. Hoffman's is absent. Clonus is not present. Toes are down-going.  Bilateral upper and lower extremity sensation is intact to light touch with exception to L5 distribution, which is diminished L>R.  No evidence of dysmetria noted.  Medical Decision Making  Imaging: MRI lumbar spine on November 21, 2019 shows an L4-5 broad-based disc herniation with associated peripheral annular tear. There is effacement of the proximal left descending L5 nerve root with mild spinal canal stenosis and left lateral recess stenosis. At L5-S1, there is right foraminal disc protrusion with mild effacement of the right exiting L5 nerve root within the neural foramen.  I have personally reviewed the images and agree with the above interpretation.  Assessment and Plan: Mr. Westrup is a pleasant 45 y.o. male with L>R L5 radiculopathies. This may be due to his L4-5 disc herniation or a combination of that disc herniation plus the right-sided far lateral L5-S1 disc herniation.  We will proceed with L4-5 discectomy and L5-S1 decompression  Venetia Night MD, [facility] Department of Neurosurgery

## 2020-09-03 NOTE — Discharge Summary (Signed)
Procedure: Left L4/5 microdiscectomy with R sided decompression as well Procedure date: 09/03/2020 Diagnosis: lumbar radiculopathy    History: BERN FARE is s/p Left L4/5 microdiscectomy with R sided decompression POD0: Tolerated procedure well. Evaluated in post op recovery, alert, oriented, moving all extremities  Physical Exam: Vitals:   09/03/20 0827 09/03/20 1119  BP: 124/68 127/81  Pulse: 92 (!) 104  Resp: 16 18  Temp: 98.3 F (36.8 C)   SpO2: 100% 100%    General: Alert and oriented, lying in bed Strength:5/5 throughout Sensation: intact and symmetric throughout  Skin: incision with dermabond   Data:  Recent Labs  Lab 08/27/20 1423  NA 139  K 3.8  CL 102  CO2 27  BUN 12  CREATININE 1.11  GLUCOSE 143*  CALCIUM 9.3   No results for input(s): AST, ALT, ALKPHOS in the last 168 hours.  Invalid input(s): TBILI   Recent Labs  Lab 08/27/20 1423  WBC 6.1  HGB 14.3  HCT 40.5  PLT 221   Recent Labs  Lab 08/27/20 1423  APTT 31  INR 0.9         Assessment/Plan:  ANDRW MCGUIRT is POD0 s/p Left L4/5 microdiscectomy with R sided decompression as well.    Once he is able to urinate, ambulate, and tolerate PO, he is cleared for discharge to home.   He will follow up in clinic in two weeks.   Patsey Berthold, NP Department of Neurosurgery

## 2020-09-03 NOTE — Discharge Instructions (Signed)
°Your surgeon has performed an operation on your lumbar spine (low back) to relieve pressure on one or more nerves. Many times, patients feel better immediately after surgery and can “overdo it.” Even if you feel well, it is important that you follow these activity guidelines. If you do not let your back heal properly from the surgery, you can increase the chance of a disc herniation and/or return of your symptoms. The following are instructions to help in your recovery once you have been discharged from the hospital. ° °* Do not take anti-inflammatory medications for 3 days after surgery (naproxen [Aleve], ibuprofen [Advil, Motrin], celecoxib [Celebrex], etc.) ° °Activity  °  °No bending, lifting, or twisting (“BLT”). Avoid lifting objects heavier than 10 pounds (gallon milk jug).  Where possible, avoid household activities that involve lifting, bending, pushing, or pulling such as laundry, vacuuming, grocery shopping, and childcare. Try to arrange for help from friends and family for these activities while your back heals. ° °Increase physical activity slowly as tolerated.  Taking short walks is encouraged, but avoid strenuous exercise. Do not jog, run, bicycle, lift weights, or participate in any other exercises unless specifically allowed by your doctor. Avoid prolonged sitting, including car rides. ° °Talk to your doctor before resuming sexual activity. ° °You should not drive until cleared by your doctor. ° °Until released by your doctor, you should not return to work or school.  You should rest at home and let your body heal.  ° °You may shower two days after your surgery.  After showering, lightly dab your incision dry. Do not take a tub bath or go swimming for 3 weeks, or until approved by your doctor at your follow-up appointment. ° °If you smoke, we strongly recommend that you quit.  Smoking has been proven to interfere with normal healing in your back and will dramatically reduce the success rate of  your surgery. Please contact [facility] (800-QUIT-NOW) and use the resources at www.[facility].com for assistance in stopping smoking. ° °Surgical Incision °  °If you have a dressing on your incision, you may remove it three days after your surgery. Keep your incision area clean and dry. ° °If you have staples or stitches on your incision, you should have a follow up scheduled for removal. If you do not have staples or stitches, you will have steri-strips (small pieces of surgical tape) or Dermabond glue. The steri-strips/glue should begin to peel away within about a week (it is fine if the steri-strips fall off before then). If the strips are still in place one week after your surgery, you may gently remove them. ° °Diet          ° ° You may return to your usual diet. Be sure to stay hydrated. ° °When to Contact Us ° °Although your surgery and recovery will likely be uneventful, you may have some residual numbness, aches, and pains in your back and/or legs. This is normal and should improve in the next few weeks. ° °However, should you experience any of the following, contact us immediately: °• New numbness or weakness °• Pain that is progressively getting worse, and is not relieved by your pain medications or rest °• Bleeding, redness, swelling, pain, or drainage from surgical incision °• Chills or flu-like symptoms °• Fever greater than 101.0 F (38.3 C) °• Problems with bowel or bladder functions °• Difficulty breathing or shortness of breath °• Warmth, tenderness, or swelling in your calf ° °Contact Information °• During office hours (Monday-Friday   9 am to 5 pm), please call your physician at [phone]  After hours and weekends, please call [phone] and an answering service will put you in touch with either Dr. Adriana Simas or Dr. Myer Haff.   For a life-threatening emergency, call 911  Bupivacaine Liposomal Suspension for Injection What is this medicine? BUPIVACAINE LIPOSOMAL (bue PIV a kane LIP oh som  al) is an anesthetic. It causes loss of feeling in the skin or other tissues. It is used to prevent and to treat pain from some procedures. This medicine may be used for other purposes; ask your health care provider or pharmacist if you have questions. COMMON BRAND NAME(S): EXPAREL What should I tell my health care provider before I take this medicine? They need to know if you have any of these conditions:  G6PD deficiency  heart disease  kidney disease  liver disease  low blood pressure  lung or breathing disease, like asthma  an unusual or allergic reaction to bupivacaine, other medicines, foods, dyes, or preservatives  pregnant or trying to get pregnant  breast-feeding How should I use this medicine? This medicine is injected into the affected area. It is given by a health care provider in a hospital or clinic setting. Talk to your health care provider about the use of this medicine in children. While it may be given to children as young as 6 years for selected conditions, precautions do apply. Overdosage: If you think you have taken too much of this medicine contact a poison control center or emergency room at once. NOTE: This medicine is only for you. Do not share this medicine with others. What if I miss a dose? This does not apply. What may interact with this medicine? This medicine may interact with the following medications:  acetaminophen  certain antibiotics like dapsone, nitrofurantoin, aminosalicylic acid, sulfonamides  certain medicines for seizures like phenobarbital, phenytoin, valproic acid  chloroquine  cyclophosphamide  flutamide  hydroxyurea  ifosfamide  metoclopramide  nitric oxide  nitroglycerin  nitroprusside  nitrous oxide  other local anesthetics like lidocaine, pramoxine, tetracaine  primaquine  quinine  rasburicase  sulfasalazine This list may not describe all possible interactions. Give your health care provider a list of  all the medicines, herbs, non-prescription drugs, or dietary supplements you use. Also tell them if you smoke, drink alcohol, or use illegal drugs. Some items may interact with your medicine. What should I watch for while using this medicine? Your condition will be monitored carefully while you are receiving this medicine. Be careful to avoid injury while the area is numb, and you are not aware of pain. What side effects may I notice from receiving this medicine? Side effects that you should report to your doctor or health care professional as soon as possible:  allergic reactions like skin rash, itching or hives, swelling of the face, lips, or tongue  seizures  signs and symptoms of a dangerous change in heartbeat or heart rhythm like chest pain; dizziness; fast, irregular heartbeat; palpitations; feeling faint or lightheaded; falls; breathing problems  signs and symptoms of methemoglobinemia such as pale, gray, or blue colored skin; headache; fast heartbeat; shortness of breath; feeling faint or lightheaded, falls; tiredness Side effects that usually do not require medical attention (report to your doctor or health care professional if they continue or are bothersome):  anxious  back pain  changes in taste  changes in vision  constipation  dizziness  fever  nausea, vomiting This list may not describe all possible side effects.  Call your doctor for medical advice about side effects. You may report side effects to FDA at 1-800-FDA-1088. Where should I keep my medicine? This drug is given in a hospital or clinic and will not be stored at home. NOTE: This sheet is a summary. It may not cover all possible information. If you have questions about this medicine, talk to your doctor, pharmacist, or health care provider.  2021 Elsevier/Gold Standard (2019-11-15 12:24:57)   AMBULATORY SURGERY  DISCHARGE INSTRUCTIONS   1) The drugs that you were given will stay in your system until  tomorrow so for the next 24 hours you should not:  A) Drive an automobile B) Make any legal decisions C) Drink any alcoholic beverage   2) You may resume regular meals tomorrow.  Today it is better to start with liquids and gradually work up to solid foods.  You may eat anything you prefer, but it is better to start with liquids, then soup and crackers, and gradually work up to solid foods.   3) Please notify your doctor immediately if you have any unusual bleeding, trouble breathing, redness and pain at the surgery site, drainage, fever, or pain not relieved by medication.  4) Your post-operative visit with Dr.                                     is: Date:                        Time:    Please call to schedule your post-operative visit.  5) Additional Instructions:

## 2020-09-03 NOTE — Anesthesia Procedure Notes (Signed)
Procedure Name: Intubation Date/Time: 09/03/2020 9:25 AM Performed by: Hermenia Bers, CRNA Pre-anesthesia Checklist: Patient identified, Patient being monitored, Timeout performed, Emergency Drugs available and Suction available Patient Re-evaluated:Patient Re-evaluated prior to induction Oxygen Delivery Method: Circle system utilized Preoxygenation: Pre-oxygenation with 100% oxygen Induction Type: IV induction Ventilation: Two handed mask ventilation required and Oral airway inserted - appropriate to patient size Laryngoscope Size: 4 and McGraph Grade View: Grade I Tube type: Oral Tube size: 7.5 mm Number of attempts: 1 Airway Equipment and Method: Stylet Placement Confirmation: ETT inserted through vocal cords under direct vision,  positive ETCO2 and breath sounds checked- equal and bilateral Secured at: 22 cm Tube secured with: Tape Dental Injury: Teeth and Oropharynx as per pre-operative assessment

## 2020-09-06 NOTE — Anesthesia Postprocedure Evaluation (Signed)
Anesthesia Post Note  Patient: Colton Hayes  Procedure(s) Performed: L4-5 MICRODISCECTOMY, L5-S1 DECOMPRESSION (N/A )  Patient location during evaluation: PACU Anesthesia Type: General Level of consciousness: awake and alert and oriented Pain management: pain level controlled Vital Signs Assessment: post-procedure vital signs reviewed and stable Respiratory status: spontaneous breathing Cardiovascular status: blood pressure returned to baseline Anesthetic complications: no   No complications documented.   Last Vitals:  Vitals:   09/03/20 1206 09/03/20 1243  BP: 119/79 120/74  Pulse: 100 97  Resp: 18 16  Temp: (!) 36.2 C   SpO2: 97% 99%    Last Pain:  Vitals:   09/04/20 1346  TempSrc:   PainSc: 4                  Phillip Maffei

## 2022-09-14 IMAGING — MR MR LUMBAR SPINE W/O CM
5 series · 31 of 48 positions shown · non-contrast
Comparison: CT abdomen/pelvis 10/13/2015. Report from lumbar spine
radiographs 03/05/2014 (images unavailable).

CLINICAL DATA: Chronic bilateral low back pain with bilateral
sciatica. Additional history provided by scanning technologist:
Patient reports fall down stairs September 2019, low back pain with
numbness/burning/tingling down both legs.

EXAM:
MRI LUMBAR SPINE WITHOUT CONTRAST
TECHNIQUE: Multiplanar, multisequence MR imaging of the lumbar spine was
performed. No intravenous contrast was administered.

[Series 5: T2 · sagittal · 4.0mm · 0.81mm/px · 6 of 17 slices shown (1 of 2)]
[im 1/17]
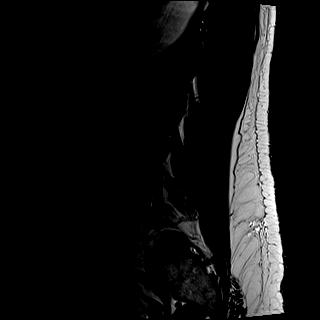
[im 4/17]
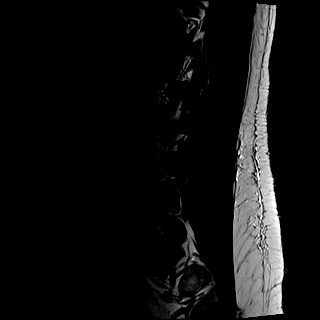
[im 7/17]
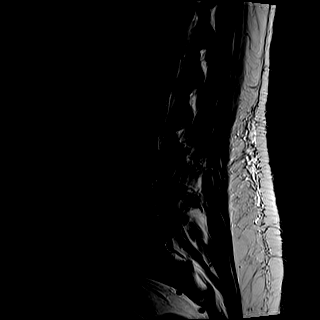
[im 10/17]
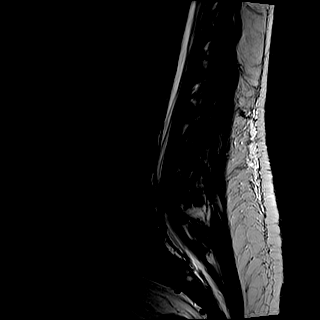
[im 13/17]
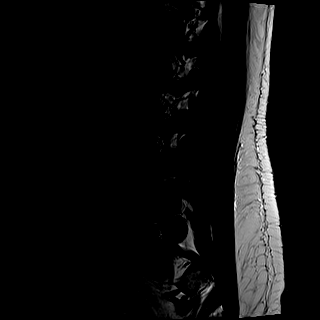
[im 17/17]
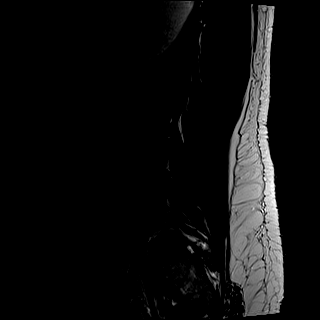

[Series 6: T1 · sagittal · 4.0mm · 0.81mm/px · 6 of 17 slices shown (1 of 2)]
[im 1/17]
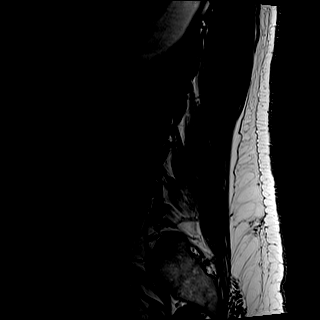
[im 4/17]
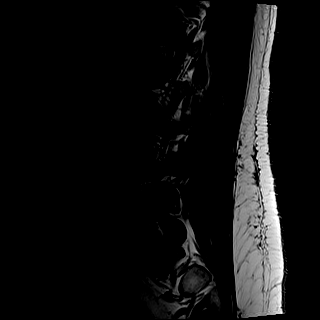
[im 7/17]
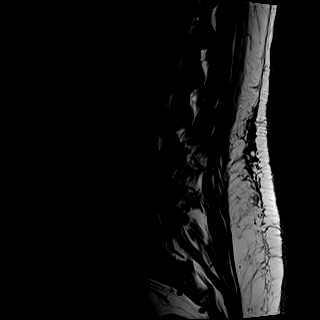
[im 10/17]
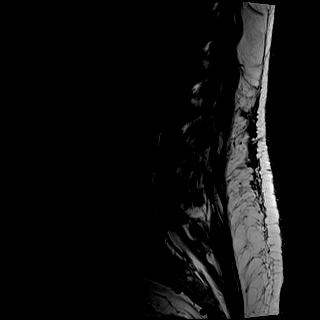
[im 13/17]
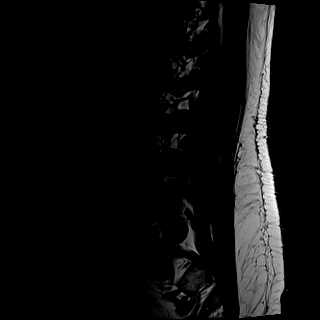
[im 17/17]
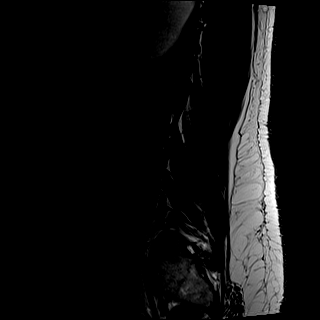

[Series 7: STIR · sagittal · 4.0mm · 0.41mm/px · 1 of 17 slices shown]
[im 1/17]
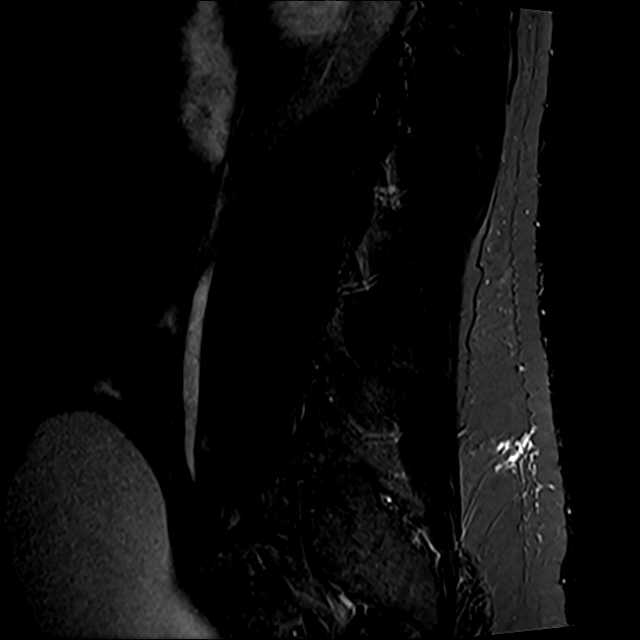

[Series 8: T2 · axial · 4.0mm · 0.78mm/px · z∈[-75,+146]mm · 9 of 39 slices shown (2 of 2)]
[im 1/39]
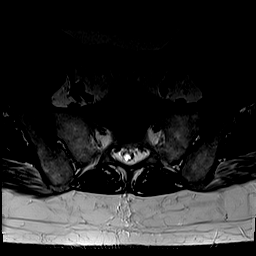
[im 6/39]
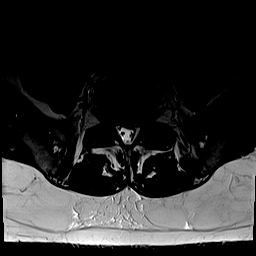
[im 11/39]
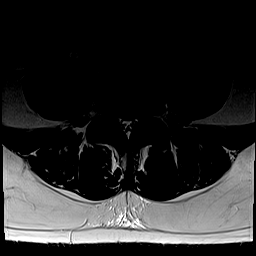
[im 17/39]
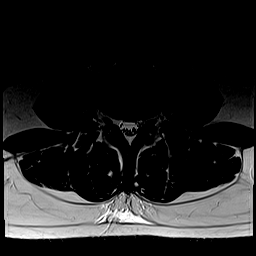
[im 20/39]
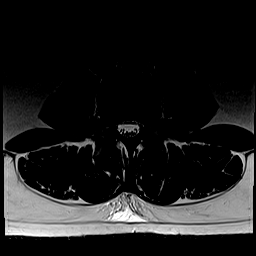
[im 22/39]
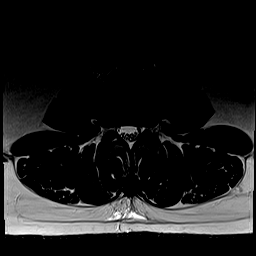
[im 28/39]
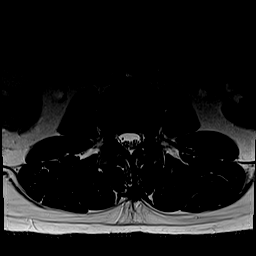
[im 33/39]
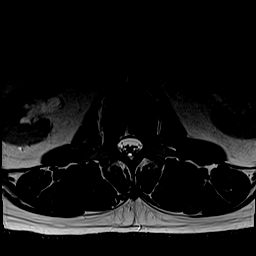
[im 39/39]
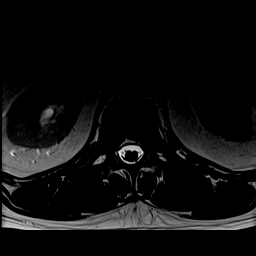

[Series 9: T1 · axial · 4.0mm · 0.39mm/px · z∈[-75,+146]mm · 9 of 39 slices shown (2 of 2)]
[im 1/39]
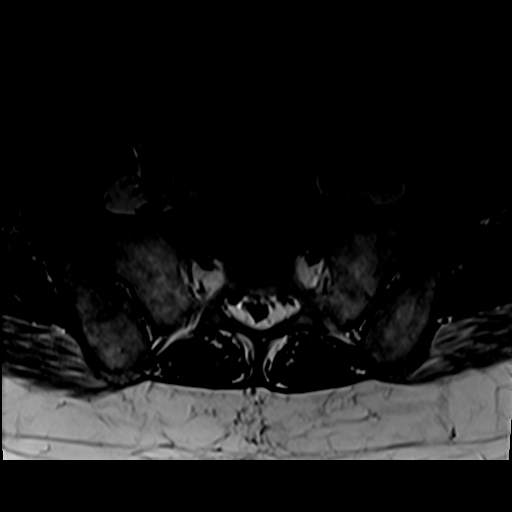
[im 6/39]
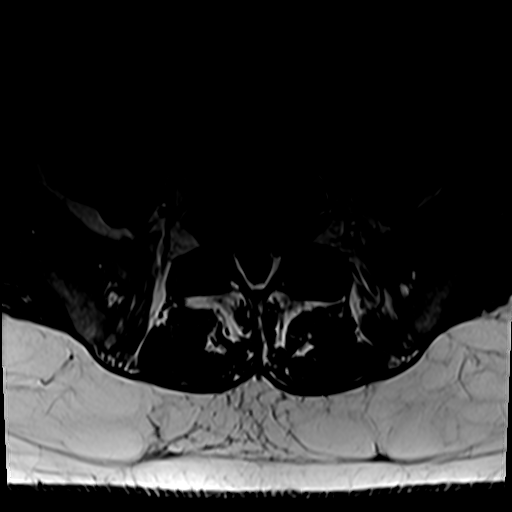
[im 11/39]
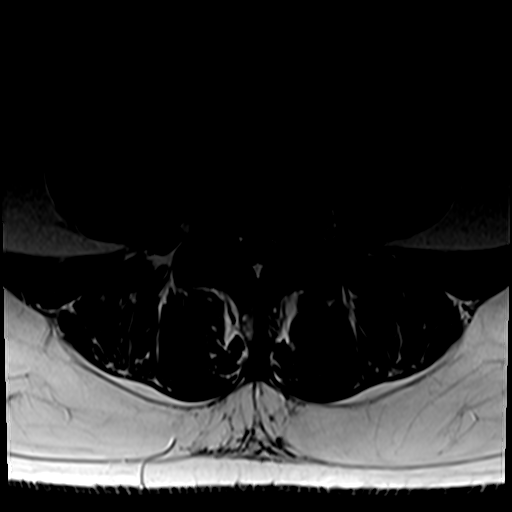
[im 17/39]
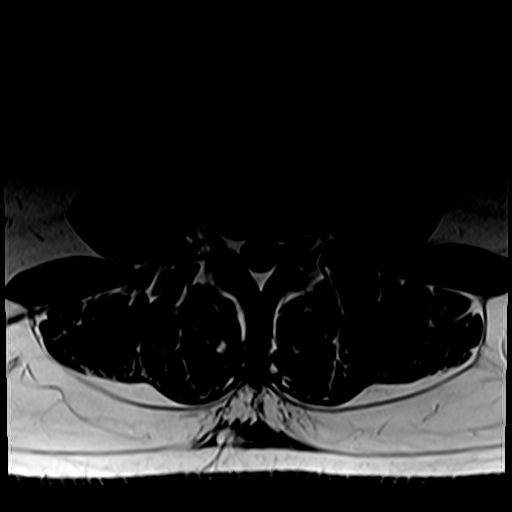
[im 20/39]
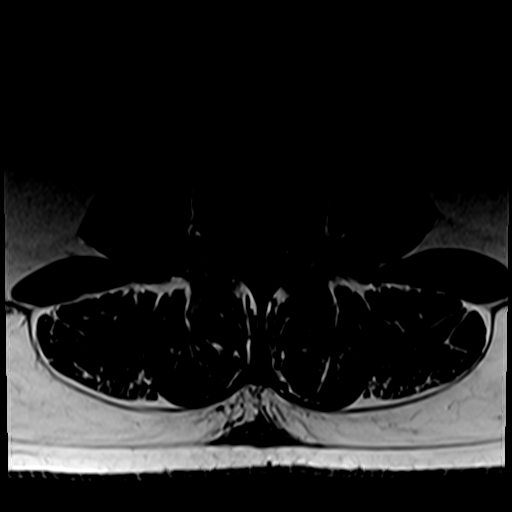
[im 22/39]
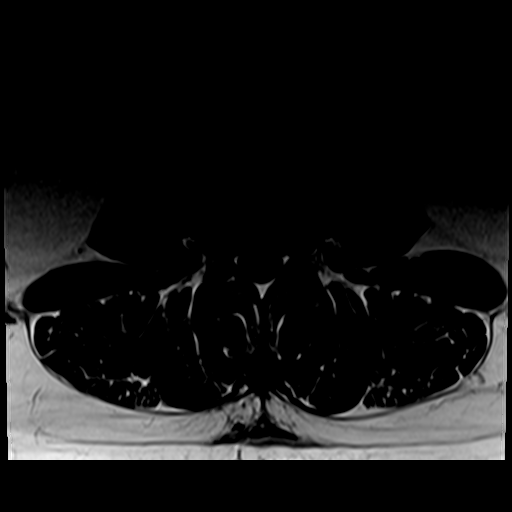
[im 28/39]
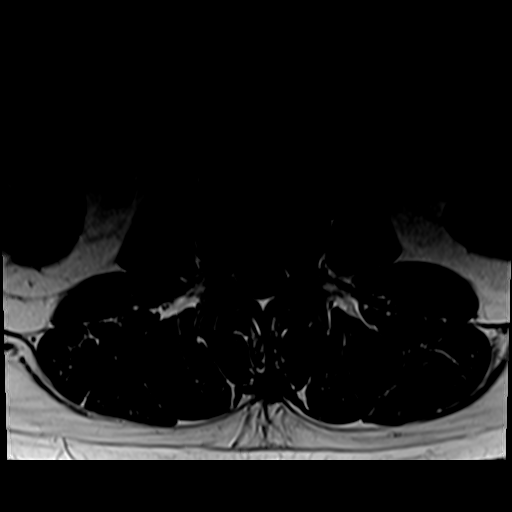
[im 33/39]
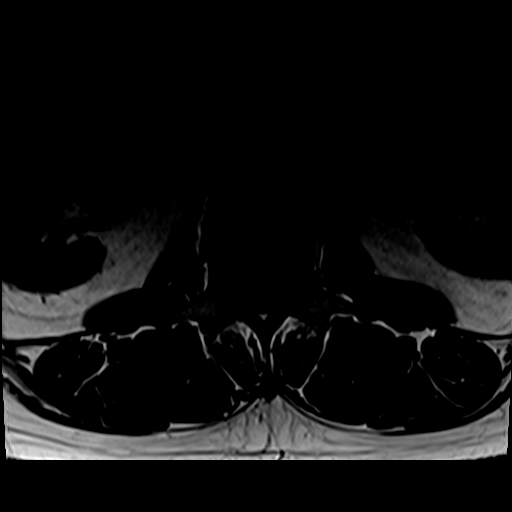
[im 39/39]
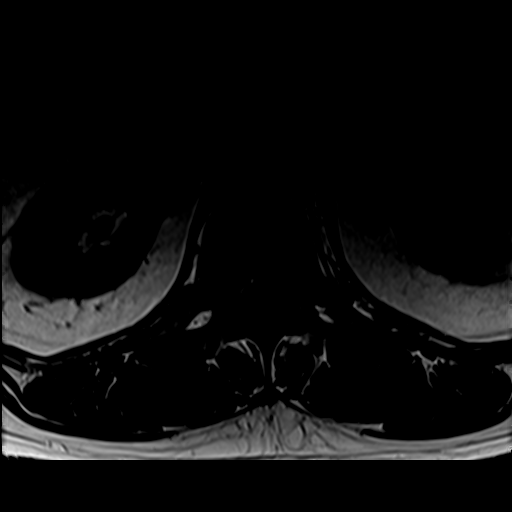

[31 of 48 positions shown; findings below may reference images not displayed]

FINDINGS: Segmentation: 5 lumbar vertebrae (correlating with the prior CT
abdomen/pelvis of 10/13/2015).

Alignment:  Physiologic.

Vertebrae: Vertebral body height is maintained. No significant
marrow edema or focal suspicious osseous lesion. Small L3 vertebral
body hemangioma. L1 inferior endplate Schmorl node.

Conus medullaris and cauda equina: Conus extends to the L1-L2 level.
No signal abnormality within the visualized distal spinal cord.

Paraspinal and other soft tissues: Distended urinary bladder.
Paraspinal soft tissues within normal limits.

Disc levels:

Mild L4-L5 and L5-S1 disc degeneration.

T12-L1: No significant disc herniation or stenosis.

L1-L2: No significant disc herniation or stenosis.

L2-L3: No significant disc herniation or stenosis.

L3-L4: Minimal disc bulge. No significant spinal canal or foraminal
stenosis.

L4-L5: Disc bulge. Superimposed broad-based central disc protrusion
at site of posterior annular fissure. Mild facet
arthrosis/ligamentum flavum hypertrophy. Moderate bilateral
subarticular stenosis with potential to affect either descending L5
nerve root (series 8, image 30). Mild narrowing of the central
canal. Mild bilateral neural foraminal narrowing (greater on the
left).

L5-S1: Broad-based right center to right foraminal partially
calcified disc protrusion at site of posterior annular fissure. Mild
facet hypertrophy. The disc protrusion contributes to mild right
subarticular narrowing and may contact the descending right S1 nerve
root (series 9, image 35). Central canal patent. Mild bilateral
neural foraminal narrowing.
IMPRESSION: Lumbar spondylosis as outlined with findings most notably as
follows.

At L4-L5, a broad-based central disc protrusion contributes to
moderate bilateral subarticular stenosis with potential to affect
either descending L5 nerve root. Multifactorial mild central canal
and bilateral neural foraminal narrowing.

At L5-S1, there is a broad-based right center to right foraminal
partially calcified disc protrusion. This contributes to mild right
subarticular narrowing and may contact the descending right S1 nerve
root. Mild bilateral neural foraminal narrowing.

## 2024-07-30 ENCOUNTER — Telehealth: Payer: Self-pay | Admitting: Internal Medicine

## 2024-07-30 NOTE — Telephone Encounter (Signed)
 Left pt vm to schedule new appointment-Toni

## 2024-08-16 LAB — COLOGUARD: COLOGUARD: NEGATIVE

## 2024-08-21 ENCOUNTER — Ambulatory Visit (INDEPENDENT_AMBULATORY_CARE_PROVIDER_SITE_OTHER): Payer: Self-pay | Admitting: Internal Medicine

## 2024-08-21 ENCOUNTER — Encounter: Payer: Self-pay | Admitting: Internal Medicine

## 2024-08-21 VITALS — BP 142/75 | HR 92 | Temp 98.0°F | Resp 16 | Ht 72.0 in | Wt 268.0 lb

## 2024-08-21 DIAGNOSIS — K219 Gastro-esophageal reflux disease without esophagitis: Secondary | ICD-10-CM

## 2024-08-21 DIAGNOSIS — G4733 Obstructive sleep apnea (adult) (pediatric): Secondary | ICD-10-CM

## 2024-08-21 NOTE — Patient Instructions (Signed)

## 2024-08-21 NOTE — Progress Notes (Signed)
 [facility] [address] [place], [place] [address]  Pulmonary Sleep Medicine   Office Visit Note  Patient Name: Colton Hayes DOB: Nov 20, 1975 MRN [id number]  Date of Service: 08/21/2024  Complaints/HPI: He has OSA for about 20 years. His last study was 20 years ago. Patient states he is in need of new supplies. He states he has gained about 10 pounds in the past few years.States he overall feels well. >Does not fall asleep driving or watching TV. I tried to see about looking up his last study unfortunately not available. I have discsussed possiblity of needing another sleep study  Office Spirometry Results:     ROS  General: (-) fever, (-) chills, (-) night sweats, (-) weakness Skin: (-) rashes, (-) itching,. Eyes: (-) visual changes, (-) redness, (-) itching. Nose and Sinuses: (-) nasal stuffiness or itchiness, (-) postnasal drip, (-) nosebleeds, (-) sinus trouble. Mouth and Throat: (-) sore throat, (-) hoarseness. Neck: (-) swollen glands, (-) enlarged thyroid, (-) neck pain. Respiratory: - cough, (-) bloody sputum, - shortness of breath, - wheezing. Cardiovascular: - ankle swelling, (-) chest pain. Lymphatic: (-) lymph node enlargement. Neurologic: (-) numbness, (-) tingling. Psychiatric: (-) anxiety, (-) depression   Current Medication: Outpatient Encounter Medications as of 08/21/2024  Medication Sig Note   ascorbic acid (VITAMIN C) 500 MG tablet Take 1,500 mg by mouth daily.    BENFOTIAMINE PO Take 300 mg by mouth daily.    cyclobenzaprine (FLEXERIL) 5 MG tablet Take 10 mg by mouth at bedtime.    fluticasone (FLONASE) 50 MCG/ACT nasal spray Place 1 spray into both nostrils daily as needed for rhinitis.    gabapentin (NEURONTIN) 300 MG capsule Take 300-600 mg by mouth See admin instructions. Take 300 mg in the morning and 600 mg at night    insulin aspart (NOVOLOG) 100 UNIT/ML injection Inject 100 Units into the skin daily. Uses in insulin pump  09/01/2016: Patient has an insulin pump   losartan (COZAAR) 100 MG tablet Take 100 mg by mouth daily.    omeprazole (PRILOSEC) 20 MG capsule Take 20 mg by mouth daily.    rosuvastatin (CRESTOR) 5 MG tablet Take 5 mg by mouth daily.    zinc gluconate 50 MG tablet Take 100 mg by mouth daily.    No facility-administered encounter medications on file as of 08/21/2024.    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 ;  Surgeon: Norleen JINNY Maltos, MD;  Location: [facility];  Service: Orthopedics;  Laterality: Right;   LUMBAR LAMINECTOMY/DECOMPRESSION MICRODISCECTOMY N/A 09/03/2020   Procedure: L4-5 MICRODISCECTOMY, L5-S1 DECOMPRESSION;  Surgeon: Clois Fret, MD;  Location: [facility];  Service: Neurosurgery;  Laterality: N/A;   SHOULDER ARTHROSCOPY W/ SUPERIOR LABRAL ANTERIOR POSTERIOR LESION REPAIR  2012   VASECTOMY      Medical History: Past Medical History:  Diagnosis Date   B12 deficiency    Diabetes mellitus without complication (HCC) 1989   uses insulin pump   GERD (gastroesophageal reflux disease)    History of chicken pox    Hyperlipidemia    Hypertension    PUD (peptic ulcer disease)    Retinopathy    diabetic (right)   RLS (restless legs syndrome)    Ruptured thoracic disc 2003   SLAP  tear of shoulder 2012   Sleep apnea    uses CPAP    Family History: History reviewed. No pertinent family history.  Social History: Social History   Socioeconomic History   Marital status: Married    Spouse name: Not on file   Number of children: Not on file   Years of education: Not on file   Highest education level: Not on file  Occupational History   Not on file  Tobacco Use   Smoking status: Former   Smokeless  tobacco: Current    Types: Snuff  Substance and Sexual Activity   Alcohol use: No    Comment: occ   Drug use: No   Sexual activity: Not on file  Other Topics Concern   Not on file  Social History Narrative   Not on file   Social Drivers of Health   Tobacco Use: High Risk (08/21/2024)   Patient History    Smoking Tobacco Use: Former    Smokeless Tobacco Use: Current    Passive Exposure: Not on file  Financial Resource Strain: Medium Risk (06/19/2024)   [facility] from [facility]   Overall Financial Resource Strain (CARDIA)    Difficulty of Paying Living Expenses: Somewhat hard  Food Insecurity: Food Insecurity Present (06/19/2024)   Received from [facility]   Epic    Within the past 12 months, you worried that your food would run out before you got the money to buy more.: Sometimes true    Within the past 12 months, the food you bought just didn't last and you didn't have money to get more.: Patient declined  Transportation Needs: No Transportation Needs (06/19/2024)   Received [facility] - Transportation    In the past 12 months, has lack of transportation kept you from medical appointments or from getting medications?: No    Lack of Transportation (Non-Medical): No  Physical Activity: Not on file  Stress: Not on file  Social Connections: Not on file  Intimate Partner Violence: Not on file  Depression (EYV7-0): Not on file  Alcohol Screen: Not on file  Housing: Unknown (06/19/2024)   [facility]   Epic    In the last 12 months, was there a time when you were not able to pay the mortgage or rent on time?: No    Number of Times Moved in the Last Year: Not on file    At any time in the past 12 months, were you homeless or living in a shelter (including now)?: No  Utilities: Not At Risk (06/19/2024)   Received from [facility]   Epic    In the past 12  months has the electric, gas, oil, or water company threatened to shut off services in your home?: No  Health Literacy: Not on file    Vital Signs: Blood pressure (!) 142/75, pulse 92, temperature 98 F (36.7 C), resp. rate 16, height 6' (1.829 m), weight 268 lb (121.6 kg), SpO2 97%.  Examination: General Appearance: The patient is well-developed, well-nourished, and in no distress. Skin: Gross inspection of skin unremarkable. Head: normocephalic, no gross deformities. Eyes: no gross deformities noted. ENT: ears appear grossly normal no exudates. Neck: Supple. No thyromegaly. No LAD. Respiratory: no rhonchi . Cardiovascular: Normal S1 and S2 without murmur or rub. Extremities: No cyanosis. pulses are equal. Neurologic: Alert and oriented. No involuntary movements.  LABS: Recent Results (from the past 2160  hours)  COLOGUARD     Status: Normal   Collection Time: 08/09/24  9:51 AM  Result Value Ref Range   COLOGUARD Negative Negative    Comment:  The Cologuard (TM) test was performed on this specimen.  NEGATIVE TEST RESULT. A negative Cologuard result indicates a low likelihood that a colorectal cancer (CRC) or advanced adenoma (adenomatous polyps with more advanced pre-malignant features) is present. The chance that a person with a negative Cologuard test has a colorectal cancer is less than 1 in 1500 (negative predictive value >99.9%) or has an advanced adenoma is less than 5.3% (negative predictive value 94.7%). These data are based on a prospective cross-sectional study of 10,000 individuals at average risk for colorectal cancer who were screened with both Cologuard and colonoscopy. (Imperiale T. et al, N Engl J Med 2014;370(14):1286-1297) The normal value (reference range) for this assay is negative.  COLOGUARD RE-SCREENING RECOMMENDATION: Periodic colorectal cancer screening is an important part of preventive healthcare for asymptomatic individuals at average risk for colorectal  cancer. Following a negative Cologuard result, the American Cancer Society and U.S. Multi-Society Task Force screening guidelines recommend a Cologuard re-screening interval of 3 years.  References: American Cancer Society Guideline for Colorectal Cancer Screening: https://www.cancer.org/cancer/colon-rectal-cancer/detection-diagnosis-staging/acs-recommendations.html.; Rex DK, Boland CR, Dominitz JK, Colorectal Cancer Screening: Recommendations for Physicians and Patients from the U.S. Multi-Society Task Force on Colorectal Cancer Screening , Am J Gastroenterology 2017; 112:1016-1030.  TEST DESCRIPTION: Composite algorithmic analysis of stool DNA-biomarkers with hemoglobin immunoassay.   Quantitative values of individual biomarkers are not reportable and are not associated with individual biomarker result reference ranges. Cologuard is intended for colorectal cancer screening of adults of either sex, 45 years or older, who are at average-risk for colorectal cancer (CRC). Cologuard has been approved for use by the U.S. FDA. The performance of Cologuard was established in a cross sectional study of average-risk adults aged 33-84. Cologuard performance in patients ages 59 to 4 years was estimated by sub-group analysis of near-age groups. Colonoscopies performed for a positive result may find as the most clinically significant lesion: colorectal cancer [4.0%], advanced adenoma (including sessile serrated polyps greater than or equal to 1cm diameter) [20%] or non- advanced adenoma [31%]; or no colorectal neoplasia [45%]. These estimates are derived from a prospective  cross-sectional screening study of 10,000 individuals at average risk for colorectal cancer who were screened with both Cologuard and colonoscopy. (Imperiale T. et al, LOISE [place] J Med 2014;370(14):1286-1297.) Cologuard may produce a false negative or false positive result (no colorectal cancer or precancerous polyp present at colonoscopy follow up). A  negative Cologuard test result does not guarantee the absence of CRC or advanced adenoma (pre-cancer). The current Cologuard screening interval is every 3 years. Science Writer and U.S. Therapist, Music). Cologuard performance data in a 10,000 patient pivotal study using colonoscopy as the reference method can be accessed at the following location: www.exactlabs.com/results. Additional description of the Cologuard test process, warnings and precautions can be found at www.cologuard.com.    Radiology: DG Lumbar Spine 2-3 Views Result Date: 09/03/2020 CLINICAL DATA:  Lumbar surgery. EXAM: DG C-ARM 1-60 MIN; LUMBAR SPINE - 2-3 VIEW FLUOROSCOPY TIME:  Fluoroscopy Time:  6 seconds COMPARISON:  MRI lumbar spine July 31, 2020. FINDINGS: Four C-arm fluoroscopic images were obtained intraoperatively and submitted for post operative interpretation. Sequential fluoroscopic images demonstrate introduction with the final image demonstrating the surgical probe projecting at the inferior aspect of the L4-L5 disc space. Please see the performing provider's procedural report for  further detail. IMPRESSION: Intraoperative fluoroscopic imaging, as detailed above. Electronically Signed   By: Gilmore GORMAN Molt MD   On: 09/03/2020 11:22   DG C-Arm 1-60 Min Result Date: 09/03/2020 CLINICAL DATA:  Lumbar surgery. EXAM: DG C-ARM 1-60 MIN; LUMBAR SPINE - 2-3 VIEW FLUOROSCOPY TIME:  Fluoroscopy Time:  6 seconds COMPARISON:  MRI lumbar spine July 31, 2020. FINDINGS: Four C-arm fluoroscopic images were obtained intraoperatively and submitted for post operative interpretation. Sequential fluoroscopic images demonstrate introduction with the final image demonstrating the surgical probe projecting at the inferior aspect of the L4-L5 disc space. Please see the performing provider's procedural report for further detail. IMPRESSION: Intraoperative fluoroscopic imaging, as detailed above. Electronically Signed   By:  Gilmore GORMAN Molt MD   On: 09/03/2020 11:22   DG C-Arm 1-60 Min-No Report Result Date: 09/03/2020 Fluoroscopy was utilized by the requesting physician.  No radiographic interpretation.    No results found.  No results found.  Assessment and Plan: There are no active problems to display for this patient.   1. OSA (obstructive sleep apnea) (Primary) Will get him supplies setup. He has not had any supplies in over a year - For home use only DME continuous positive airway pressure (CPAP)  2. Obesity, morbid (HCC) Obesity Counseling: Had a lengthy discussion regarding patients BMI and weight issues. Patient was instructed on portion control as well as increased activity. Also discussed caloric restrictions with trying to maintain intake less than 2000 Kcal.  3. GERD without esophagitis On PPI will continue   General Counseling: I have discussed the findings of the evaluation and examination with Venetia.  I have also discussed any further diagnostic evaluation thatmay be needed or ordered today. Jep verbalizes understanding of the findings of todays visit. We also reviewed his medications today and discussed drug interactions and side effects including but not limited excessive drowsiness and altered mental states. We also discussed that there is always a risk not just to him but also people around him. he has been encouraged to call the office with any questions or concerns that should arise related to todays visit.  Orders Placed This Encounter  Procedures   For home use only DME continuous positive airway pressure (CPAP)    AutoSet 5-18cwp    Length of Need:   Lifetime    Patient has OSA or probable OSA:   Yes    Is the patient currently using CPAP in the home:   Yes    If yes (to question two):   Determine DME provider and inform them of any new orders/settings    Settings:   Autotitration    CPAP supplies needed:   Mask, headgear, cushions, filters, heated tubing and water  chamber     Time spent: 55  I have personally obtained a history, examined the patient, evaluated laboratory and imaging results, formulated the assessment and plan and placed orders.    Elfreda DELENA Bathe, MD [facility] Pulmonary and Critical Care Sleep medicine

## 2024-08-22 ENCOUNTER — Telehealth: Payer: Self-pay | Admitting: Internal Medicine

## 2024-08-22 NOTE — Telephone Encounter (Addendum)
 Lvm for patient to return my call with name of his cpap DME-Toni  Called patient's pcp @ [facility]. Nurse will call me back to let me know if they have name of DME, and copy of sleep study-Toni

## 2024-08-24 ENCOUNTER — Telehealth: Payer: Self-pay | Admitting: Internal Medicine

## 2024-08-24 NOTE — Telephone Encounter (Addendum)
 Received call from [facility]. Patient had SS done over 20 years ago @ [facility]. He uses [employer] [employer] [facility] with [employer]). I sent text msg to [place] to see if they handle supply orders for this company-Toni  Per rep @ [employer], central handles orders for [employer]. Supply order faxed to them; [phone]

## 2024-08-29 ENCOUNTER — Telehealth: Payer: Self-pay | Admitting: Internal Medicine

## 2024-08-29 NOTE — Telephone Encounter (Signed)
 Received cpap supply order from [employer]. Gave to DSK for signatures-Toni

## 2024-09-04 ENCOUNTER — Telehealth: Payer: Self-pay | Admitting: Internal Medicine

## 2024-09-04 NOTE — Telephone Encounter (Signed)
 08/21/24 Cpap supply orders signed & faxed back to [facility]; [phone]. Scanned-Toni

## 2025-08-26 ENCOUNTER — Ambulatory Visit: Admitting: Internal Medicine
